# Patient Record
Sex: Female | Born: 1995 | Race: White | Hispanic: No | Marital: Single | Smoking: Never smoker
Health system: Southern US, Community
[De-identification: ages and names within clinical notes are randomized; demographics above are authoritative.]

## PROBLEM LIST (undated history)

## (undated) DIAGNOSIS — J45909 Unspecified asthma, uncomplicated: Secondary | ICD-10-CM

## (undated) DIAGNOSIS — E119 Type 2 diabetes mellitus without complications: Secondary | ICD-10-CM

## (undated) DIAGNOSIS — N200 Calculus of kidney: Secondary | ICD-10-CM

## (undated) HISTORY — DX: Calculus of kidney: N20.0

## (undated) HISTORY — DX: Type 2 diabetes mellitus without complications: E11.9

## (undated) HISTORY — DX: Unspecified asthma, uncomplicated: J45.909

## (undated) HISTORY — PX: OTHER SURGICAL HISTORY: SHX169

---
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
## 2015-06-28 DIAGNOSIS — L03112 Cellulitis of left axilla: Secondary | ICD-10-CM | POA: Diagnosis not present

## 2015-08-06 DIAGNOSIS — Z113 Encounter for screening for infections with a predominantly sexual mode of transmission: Secondary | ICD-10-CM | POA: Diagnosis not present

## 2015-08-06 DIAGNOSIS — Z01419 Encounter for gynecological examination (general) (routine) without abnormal findings: Secondary | ICD-10-CM | POA: Diagnosis not present

## 2015-08-06 DIAGNOSIS — Z6829 Body mass index (BMI) 29.0-29.9, adult: Secondary | ICD-10-CM | POA: Diagnosis not present

## 2015-08-12 DIAGNOSIS — N390 Urinary tract infection, site not specified: Secondary | ICD-10-CM | POA: Diagnosis not present

## 2015-11-28 DIAGNOSIS — N9089 Other specified noninflammatory disorders of vulva and perineum: Secondary | ICD-10-CM | POA: Diagnosis not present

## 2015-12-11 DIAGNOSIS — L905 Scar conditions and fibrosis of skin: Secondary | ICD-10-CM | POA: Diagnosis not present

## 2015-12-16 DIAGNOSIS — J029 Acute pharyngitis, unspecified: Secondary | ICD-10-CM | POA: Diagnosis not present

## 2016-03-19 DIAGNOSIS — J351 Hypertrophy of tonsils: Secondary | ICD-10-CM | POA: Diagnosis not present

## 2016-03-19 DIAGNOSIS — K219 Gastro-esophageal reflux disease without esophagitis: Secondary | ICD-10-CM | POA: Diagnosis not present

## 2016-03-19 DIAGNOSIS — J358 Other chronic diseases of tonsils and adenoids: Secondary | ICD-10-CM | POA: Diagnosis not present

## 2016-06-25 DIAGNOSIS — N390 Urinary tract infection, site not specified: Secondary | ICD-10-CM | POA: Diagnosis not present

## 2017-01-14 DIAGNOSIS — Z1389 Encounter for screening for other disorder: Secondary | ICD-10-CM | POA: Diagnosis not present

## 2017-01-14 DIAGNOSIS — M542 Cervicalgia: Secondary | ICD-10-CM | POA: Diagnosis not present

## 2017-01-14 DIAGNOSIS — J302 Other seasonal allergic rhinitis: Secondary | ICD-10-CM | POA: Diagnosis not present

## 2017-01-14 DIAGNOSIS — H04129 Dry eye syndrome of unspecified lacrimal gland: Secondary | ICD-10-CM | POA: Diagnosis not present

## 2018-01-18 DIAGNOSIS — J40 Bronchitis, not specified as acute or chronic: Secondary | ICD-10-CM | POA: Diagnosis not present

## 2018-01-18 DIAGNOSIS — Z6838 Body mass index (BMI) 38.0-38.9, adult: Secondary | ICD-10-CM | POA: Diagnosis not present

## 2018-01-18 DIAGNOSIS — R05 Cough: Secondary | ICD-10-CM | POA: Diagnosis not present

## 2018-01-18 DIAGNOSIS — J069 Acute upper respiratory infection, unspecified: Secondary | ICD-10-CM | POA: Diagnosis not present

## 2018-01-23 ENCOUNTER — Emergency Department (HOSPITAL_COMMUNITY): Payer: BLUE CROSS/BLUE SHIELD

## 2018-01-23 ENCOUNTER — Other Ambulatory Visit: Payer: Self-pay

## 2018-01-23 ENCOUNTER — Encounter (HOSPITAL_COMMUNITY): Payer: Self-pay | Admitting: Emergency Medicine

## 2018-01-23 ENCOUNTER — Emergency Department (HOSPITAL_COMMUNITY)
Admission: EM | Admit: 2018-01-23 | Discharge: 2018-01-23 | Disposition: A | Discharge status: Home / Self Care | Payer: BLUE CROSS/BLUE SHIELD | Attending: Emergency Medicine | Admitting: Emergency Medicine

## 2018-01-23 DIAGNOSIS — R0602 Shortness of breath: Secondary | ICD-10-CM | POA: Diagnosis not present

## 2018-01-23 DIAGNOSIS — R000 Tachycardia, unspecified: Secondary | ICD-10-CM | POA: Diagnosis not present

## 2018-01-23 LAB — CBC WITH DIFFERENTIAL/PLATELET
Abs Immature Granulocytes: 0.04 10*3/uL (ref 0.00–0.07)
Basophils Absolute: 0 10*3/uL (ref 0.0–0.1)
Basophils Relative: 0 %
Eosinophils Absolute: 0.1 10*3/uL (ref 0.0–0.5)
Eosinophils Relative: 1 %
HCT: 42.5 % (ref 36.0–46.0)
Hemoglobin: 13.9 g/dL (ref 12.0–15.0)
Immature Granulocytes: 0 %
Lymphocytes Relative: 11 %
Lymphs Abs: 1.2 10*3/uL (ref 0.7–4.0)
MCH: 29.2 pg (ref 26.0–34.0)
MCHC: 32.7 g/dL (ref 30.0–36.0)
MCV: 89.3 fL (ref 80.0–100.0)
Monocytes Absolute: 0.5 10*3/uL (ref 0.1–1.0)
Monocytes Relative: 5 %
Neutro Abs: 8.8 10*3/uL — ABNORMAL HIGH (ref 1.7–7.7)
Neutrophils Relative %: 83 %
Platelets: 311 10*3/uL (ref 150–400)
RBC: 4.76 MIL/uL (ref 3.87–5.11)
RDW: 12.5 % (ref 11.5–15.5)
WBC: 10.7 10*3/uL — ABNORMAL HIGH (ref 4.0–10.5)
nRBC: 0 % (ref 0.0–0.2)

## 2018-01-23 LAB — BASIC METABOLIC PANEL
Anion gap: 13 (ref 5–15)
BUN: 11 mg/dL (ref 6–20)
CO2: 20 mmol/L — ABNORMAL LOW (ref 22–32)
Calcium: 9.6 mg/dL (ref 8.9–10.3)
Chloride: 108 mmol/L (ref 98–111)
Creatinine, Ser: 0.76 mg/dL (ref 0.44–1.00)
GFR calc Af Amer: >60 mL/min (ref >60)
GFR calc non Af Amer: >60 mL/min (ref >60)
Glucose, Bld: 108 mg/dL — ABNORMAL HIGH (ref 70–99)
Potassium: 3.3 mmol/L — ABNORMAL LOW (ref 3.5–5.1)
Sodium: 141 mmol/L (ref 135–145)

## 2018-01-23 LAB — I-STAT BETA HCG BLOOD, ED (MC, WL, AP ONLY): I-stat hCG, quantitative: <5 m[IU]/mL (ref <5)

## 2018-01-23 LAB — D-DIMER, QUANTITATIVE: D-Dimer, Quant: <0.27 ug/mL-FEU (ref 0.00–0.50)

## 2018-01-23 MED ORDER — FLUTICASONE PROPIONATE 50 MCG/ACT NA SUSP: 1.0000 | Freq: Every day | NASAL | 0 refills | Status: DC

## 2018-01-23 MED ORDER — SODIUM CHLORIDE 0.9 % IV BOLUS
1000.0000 mL | Freq: Once | INTRAVENOUS | Status: AC
  Administered 2018-01-23: 1000 mL via INTRAVENOUS

## 2018-01-23 MED ORDER — ALBUTEROL SULFATE HFA 108 (90 BASE) MCG/ACT IN AERS: 1.0000 | INHALATION_SPRAY | Freq: Four times a day (QID) | RESPIRATORY_TRACT | 0 refills | Status: AC | PRN

## 2018-01-23 MED ORDER — POTASSIUM CHLORIDE CRYS ER 20 MEQ PO TBCR
30.0000 meq | EXTENDED_RELEASE_TABLET | Freq: Once | ORAL | Status: AC
  Administered 2018-01-23: 30 meq via ORAL
  Filled 2018-01-23: qty 1

## 2018-01-23 MED ORDER — NAPROXEN 500 MG PO TABS: 500.0000 mg | ORAL_TABLET | Freq: Two times a day (BID) | ORAL | 0 refills | Status: DC

## 2018-01-23 MED ORDER — BENZONATATE 100 MG PO CAPS: 100.0000 mg | ORAL_CAPSULE | Freq: Three times a day (TID) | ORAL | 0 refills | Status: DC

## 2018-01-23 NOTE — ED Provider Notes (Signed)
[facility]-EMERGENCY DEPT Provider Note   CSN: [id number] Arrival date & time: 01/23/18  1911     History   Chief Complaint Chief Complaint  Patient presents with  . Shortness of Breath    HPI Robynn PaneKayleigh M Grandberry is a 23 y.o. female without significant past medical hx who presents to the ED with complaints of dyspnea x 2-3 weeks. She states dyspnea is intermittent without specific alleviating/aggravating factors or triggers. Relays some chest discomfort bilaterally when taking a deep breath, otherwise no significant chest pain, does have some pain with coughing. She states she has also had some URI sxs including congestion, dry cough, ear discomfort, and chills. Seen by PCP 5 days prior, had CXR in office that PCP interpreted as possible pneumonia pending formal radiology read. She was given prescriptions for Levaquin, prednisone, and ventolin inhaler. States that she has been taking levaquin as prescribed and using ventolin once per day. States ventolin sometimes helps, she did use it just PTA without much change. She has not taken the prednisone secondary to other family members having adverse reactions. She states since starting abx she has had some intermittent lightheadedness and admits to decreased appetite. Denies fever, chills,  leg pain/swelling, hemoptysis, recent surgery/trauma, recent long travel, hormone use, personal hx of cancer, or hx of DVT/PE.    HPI  History reviewed. No pertinent past medical history.  There are no active problems to display for this patient.   History reviewed. No pertinent surgical history.   OB History   No obstetric history on file.      Home Medications    Prior to Admission medications   Not on File    Family History No family history on file.  Social History Social History   Tobacco Use  . Smoking status: Not on file  Substance Use Topics  . Alcohol use: Not on file  . Drug use: Not on file      Allergies   Patient has no known allergies.   Review of Systems Review of Systems  Constitutional: Positive for appetite change and chills. Negative for fever.  HENT: Positive for congestion and ear pain. Negative for sore throat.   Respiratory: Positive for cough and shortness of breath.   Cardiovascular: Positive for chest pain. Negative for leg swelling.  Gastrointestinal: Negative for abdominal pain, diarrhea and vomiting.  Genitourinary: Negative for dysuria.  Neurological: Positive for light-headedness. Negative for syncope.  All other systems reviewed and are negative.    Physical Exam Updated Vital Signs BP (!) 150/76 (BP Location: Left Arm)   Pulse (!) 112   Temp 97.9 F (36.6 C) (Oral)   Resp 16   Ht 5\' 3"  (1.6 m)   Wt 99.8 kg   LMP 10/26/2017 (Approximate)   SpO2 100%   BMI 38.97 kg/m   Physical Exam Vitals signs and nursing note reviewed.  Constitutional:      General: She is not in acute distress.    Appearance: She is well-developed.  HENT:     Head: Normocephalic and atraumatic.     Right Ear: Ear canal and external ear normal. Tympanic membrane is not perforated, erythematous, retracted or bulging.     Left Ear: Ear canal and external ear normal. Tympanic membrane is not perforated, erythematous, retracted or bulging.     Nose: Congestion present.     Mouth/Throat:     Pharynx: Uvula midline. No oropharyngeal exudate or posterior oropharyngeal erythema.  Eyes:     General:  Right eye: No discharge.        Left eye: No discharge.     Conjunctiva/sclera: Conjunctivae normal.     Pupils: Pupils are equal, round, and reactive to light.  Neck:     Musculoskeletal: Normal range of motion and neck supple. No edema or neck rigidity.  Cardiovascular:     Rate and Rhythm: Regular rhythm. Tachycardia present.     Heart sounds: No murmur.  Pulmonary:     Effort: Pulmonary effort is normal. No respiratory distress.     Breath sounds: Normal  breath sounds. No wheezing, rhonchi or rales.  Chest:     Chest wall: Tenderness (anterior lateral chest wall bilaterally without palpable deformity or crepitus, no overlying skin changes) present.  Abdominal:     General: There is no distension.     Palpations: Abdomen is soft.     Tenderness: There is no abdominal tenderness.  Musculoskeletal:     Right lower leg: She exhibits no tenderness. No edema.     Left lower leg: She exhibits no tenderness. No edema.  Lymphadenopathy:     Cervical: No cervical adenopathy.  Skin:    General: Skin is warm and dry.     Findings: No rash.  Neurological:     Mental Status: She is alert.     Comments: CN III-XII grossly intact. Negative pronator drift. Ambulatory w/ steady gait.   Psychiatric:        Behavior: Behavior normal.      ED Treatments / Results  Labs (all labs ordered are listed, but only abnormal results are displayed) Labs Reviewed  BASIC METABOLIC PANEL - Abnormal; Notable for the following components:      Result Value   Potassium 3.3 (*)    CO2 20 (*)    Glucose, Bld 108 (*)    All other components within normal limits  CBC WITH DIFFERENTIAL/PLATELET - Abnormal; Notable for the following components:   WBC 10.7 (*)    Neutro Abs 8.8 (*)    All other components within normal limits  D-DIMER, QUANTITATIVE (NOT AT [facility])  I-STAT BETA HCG BLOOD, ED ([facility], [facility], [facility] ONLY)    EKG EKG Interpretation  Date/Time:  Monday January 23 2018 20:37:15 EST Ventricular Rate:  91 PR Interval:    QRS Duration: 88 QT Interval:  351 QTC Calculation: 432 R Axis:   76 Text Interpretation:  Sinus rhythm No significant change since last tracing Confirmed by Linwood Dibbles [phone]) on 01/24/2018 12:54:22 PM   Radiology Dg Chest 2 View  Result Date: 01/23/2018 CLINICAL DATA:  Shortness of Breath EXAM: CHEST - 2 VIEW COMPARISON:  None. FINDINGS: The lungs are clear. The heart size and pulmonary vascularity are normal. No adenopathy. No bone  lesions. IMPRESSION: No edema or consolidation. Electronically Signed   By: Bretta Bang III M.D.   On: 01/23/2018 20:17    Procedures Procedures (including critical care time)  Medications Ordered in ED Medications - No data to display   Initial Impression / Assessment and Plan / ED Course  I have reviewed the triage vital signs and the nursing notes.  Pertinent labs & imaging results that were available during my care of the patient were reviewed by me and considered in my medical decision making (see chart for details).   Patient presents to the ED with complaints of dyspnea. Nontoxic appearing, in no apparent distress, initial vitals notable for tachycardia & HTN, low suspicion for HTN emergency. Tachycardic on exam. Lungs CTA,  no evidence of respiratory distress, SpO2 100% on RA. DDX: pneumonia, pneumothorax, effusion, edema, pulmonary embolism, ACS, viral URI, anxiety. Evaluate with CXR, EKG, and labs to include d-dimer given patient is low risks wells but is not PERC negative secondary to initial tachycardia.   CXR negative- no evidence of infiltrate to suggest pneumonia, patient is currently on Levaquin and without concerning CXR findings does not seem consistent with failed outpatient therapy. No pneumothorax, effusion, edema, or structural abnormalities noted. Labs with leukocytosis at 10.7, feel this is nonspecific currently. No anemia. Mild hypokalemia at 3.3- oral replacement and diet information. No other electrolyte derangement. Bicarb mildly low at 20, no gap. Patient is not pregnant. EKG without ischemic changes or concerning arrhythmia. D-dimer negative- doubt PE. I personally ambulated patient on monitory, ambulatory SpO2 100%. HR normalized. Feel her lightheadedness may be due to abx consumption w/ decreased PO intake, no neuro deficits, syncope, or arrhythmias to raise concern regarding this complaint. Overall suspect viral URI, possibly bronchitis given she has some  improvement with ventolin at home. No wheezing here to indicate need for neb tx. Encouraged her to complete steroids at home and follow up closely w/ PCP. Additional symptomatic care with naproxen, flonase, & tessalon. I discussed results, treatment plan, need for follow-up, and return precautions with the patient. Provided opportunity for questions, patient confirmed understanding and is in agreement with plan.   Vitals:   01/23/18 2200 01/23/18 2212  BP:  (!) 115/92  Pulse:  78  Resp: 16   Temp: 97.8 F (36.6 C)   SpO2:  100%    Final Clinical Impressions(s) / ED Diagnoses   Final diagnoses:  SOB (shortness of breath)    ED Discharge Orders         Ordered    fluticasone (FLONASE) 50 MCG/ACT nasal spray  Daily     01/23/18 2204    benzonatate (TESSALON) 100 MG capsule  Every 8 hours     01/23/18 2204    albuterol (PROVENTIL HFA;VENTOLIN HFA) 108 (90 Base) MCG/ACT inhaler  Every 6 hours PRN     01/23/18 2204    naproxen (NAPROSYN) 500 MG tablet  2 times daily     01/23/18 2204           Cherly Andersonetrucelli, Daniella Dewberry R, PA-C 01/24/18 2205    Raeford RazorKohut, Stephen, MD 01/25/18 1601

## 2018-01-23 NOTE — Discharge Instructions (Signed)
You were seen in the emergency today for upper respiratory symptoms. Your chest xray was normal. Your labs were reassuring- your potassium was a bit low, follow the attached guidelines.  we suspect your symptoms are related to a virus at this time. Please take the following medicines.   -Flonase to be used 1 spray in each nostril daily.  This medication is used to treat your congestion.  -Tessalon can be taken once every 8 hours as needed.  This medication is used to treat your cough.  -- Naproxen is a nonsteroidal anti-inflammatory medication that will help with pain and swelling. Be sure to take this medication as prescribed with food, 1 pill every 12 hours,  It should be taken with food, as it can cause stomach upset, and more seriously, stomach bleeding. Do not take other nonsteroidal anti-inflammatory medications with this such as Advil, Motrin, Aleve, Mobic, Goodie Powder, or Motrin.    - Albuterol inhaler- use 1-2 puffs every 4-6 hours as needed for shortness of breath, wheezing, or chest tightness.   You make take Tylenol per over the counter dosing with these medications.   We have prescribed you new medication(s) today. Discuss the medications prescribed today with your pharmacist as they can have adverse effects and interactions with your other medicines including over the counter and prescribed medications. Seek medical evaluation if you start to experience new or abnormal symptoms after taking one of these medicines, seek care immediately if you start to experience difficulty breathing, feeling of your throat closing, facial swelling, or rash as these could be indications of a more serious allergic reaction  You will need to follow-up with your primary care provider in 1 week if your symptoms have not improved.  If you do not have a primary care provider one is provided in your discharge instructions.  Return to the emergency department for any new or worsening symptoms including but not  limited to persistent fever for 5 days, difficulty breathing, chest pain, rashes, passing out, or any other concerns.

## 2018-01-23 NOTE — ED Triage Notes (Signed)
Pt arriving with shortness of breath. Has been taking Levaquin since last Thursday after being diagnosed with possible pneumonia. Pt reports symptoms have only gotten worse. Also having dizziness.

## 2018-03-15 DIAGNOSIS — R0602 Shortness of breath: Secondary | ICD-10-CM | POA: Diagnosis not present

## 2018-03-15 DIAGNOSIS — R062 Wheezing: Secondary | ICD-10-CM | POA: Diagnosis not present

## 2018-03-20 ENCOUNTER — Ambulatory Visit (INDEPENDENT_AMBULATORY_CARE_PROVIDER_SITE_OTHER): Payer: BLUE CROSS/BLUE SHIELD | Admitting: Emergency Medicine

## 2018-03-20 ENCOUNTER — Encounter: Payer: Self-pay | Admitting: Emergency Medicine

## 2018-03-20 VITALS — BP 110/80 | HR 76 | Ht 64.0 in | Wt 213.0 lb

## 2018-03-20 DIAGNOSIS — R0602 Shortness of breath: Secondary | ICD-10-CM | POA: Diagnosis not present

## 2018-03-20 DIAGNOSIS — R0600 Dyspnea, unspecified: Secondary | ICD-10-CM | POA: Diagnosis not present

## 2018-03-20 NOTE — Patient Instructions (Signed)
We will order pulmonary function testing to evaluate your breathing and airflow.  Unclear whether you may be having asthma symptoms. Go ahead and finish the prednisone that you were given at urgent care You can keep your albuterol available to use 2 puffs if needed for shortness of breath, chest tightness, wheezing. Follow with Dr Delton Coombes next available with full PFT on the same day

## 2018-03-20 NOTE — Progress Notes (Signed)
Subjective:    Patient ID: Becky Armstrong, female    DOB: 01-20-1995, 23 y.o.   MRN: [id number]  HPI 23 year old never smoker with little past medical history. She doesn't have a formal dx of childhood asthma, did have allergies, did have an inhaled BD as a youth.  She is referred today for evaluation of dyspnea.  She was seen in the emergency department in mid January with 2 to 3 weeks of shortness of breath, discomfort with a deep breath, possibly some associated URI symptoms.  Prior to that she been treated for a possible clinical pneumonia.  Chest x-ray on 01/23/2018 was normal. She does have allergic rhinitis in Spring and Summer, sometimes the Fall. Sometimes takes allegra.   She reports today that she well until she developed some increased cough, sinus congestion and pressure, not a lot of drainage. Began to have cough, initially dry, occasionally now prod of some colored mucous. She describes difficulty taking a deep breath, can happen with activity, with stairs. She had an episode of acute dyspnea, "heart racing", anxious, was seen at [facility]. She was started on prednisone at [facility] (4 days ago). No CP, wheeze.    Review of Systems  Constitutional: Negative for fever and unexpected weight change.  HENT: Negative for congestion, dental problem, ear pain, nosebleeds, postnasal drip, rhinorrhea, sinus pressure, sneezing, sore throat and trouble swallowing.   Eyes: Negative for redness and itching.  Respiratory: Positive for cough, chest tightness, shortness of breath and wheezing.   Cardiovascular: Negative for palpitations and leg swelling.  Gastrointestinal: Negative for nausea and vomiting.  Genitourinary: Negative for dysuria.  Musculoskeletal: Negative for joint swelling.  Skin: Negative for rash.  Neurological: Negative for headaches.  Hematological: Does not bruise/bleed easily.  Psychiatric/Behavioral: Negative for dysphoric mood. The patient is not nervous/anxious.     No past  medical history on file.   No family history on file.   Social History   Socioeconomic History  . Marital status: Single    Spouse name: Not on file  . Number of children: Not on file  . Years of education: Not on file  . Highest education level: Not on file  Occupational History  . Not on file  Social Needs  . Financial resource strain: Not on file  . Food insecurity:    Worry: Not on file    Inability: Not on file  . Transportation needs:    Medical: Not on file    Non-medical: Not on file  Tobacco Use  . Smoking status: Never Smoker  . Smokeless tobacco: Never Used  Substance and Sexual Activity  . Alcohol use: Not on file  . Drug use: Not on file  . Sexual activity: Not on file  Lifestyle  . Physical activity:    Days per week: Not on file    Minutes per session: Not on file  . Stress: Not on file  Relationships  . Social connections:    Talks on phone: Not on file    Gets together: Not on file    Attends religious service: Not on file    Active member of club or organization: Not on file    Attends meetings of clubs or organizations: Not on file    Relationship status: Not on file  . Intimate partner violence:    Fear of current or ex partner: Not on file    Emotionally abused: Not on file    Physically abused: Not on file  Forced sexual activity: Not on file  Other Topics Concern  . Not on file  Social History Narrative  . Not on file     No Known Allergies   Outpatient Medications Prior to Visit  Medication Sig Dispense Refill  . albuterol (PROVENTIL HFA;VENTOLIN HFA) 108 (90 Base) MCG/ACT inhaler Inhale 1-2 puffs into the lungs every 6 (six) hours as needed for wheezing or shortness of breath. 1 Inhaler 0  . predniSONE (DELTASONE) 10 MG tablet Take as directed - Taper    . benzonatate (TESSALON) 100 MG capsule Take 1 capsule (100 mg total) by mouth every 8 (eight) hours. 21 capsule 0  . fluticasone (FLONASE) 50 MCG/ACT nasal spray Place 1 spray  into both nostrils daily. 16 g 0  . naproxen (NAPROSYN) 500 MG tablet Take 1 tablet (500 mg total) by mouth 2 (two) times daily. 10 tablet 0   No facility-administered medications prior to visit.         Objective:   Physical Exam  Vitals:   03/20/18 1111  BP: 110/80  Pulse: 76  SpO2: 98%  Weight: 213 lb (96.6 kg)  Height: 5\' 4"  (1.626 m)   Gen: Pleasant, overwt, in no distress,  normal affect  ENT: No lesions,  mouth clear,  oropharynx clear, no postnasal drip  Neck: No JVD, no stridor  Lungs: No use of accessory muscles, no crackles or wheezing on normal respiration, no wheeze on forced expiration  Cardiovascular: RRR, heart sounds normal, no murmur or gallops, no peripheral edema  Musculoskeletal: No deformities, no cyanosis or clubbing  Neuro: alert, awake, non focal  Skin: Warm, no lesions or rash    Assessment & Plan:  Dyspnea Etiology unclear.  She and her mother both note that there is a contribution of allergies, possibly also anxiety.  She does have symptoms that could be consistent with obstruction, including occasional wheezing.  She feels better on the prednisone that she just received.  I think she needs pulmonary function testing to determine whether she has true AFL, if so then this is likely adult onset asthma.  She is concerned about the cost the PFT which is understandable.  She will let me know if she feels like she can proceed.  Levy Pupa, MD, PhD 03/20/2018, 11:52 AM [facility] [phone] or if no answer [phone]

## 2018-03-20 NOTE — Assessment & Plan Note (Signed)
Etiology unclear.  She and her mother both note that there is a contribution of allergies, possibly also anxiety.  She does have symptoms that could be consistent with obstruction, including occasional wheezing.  She feels better on the prednisone that she just received.  I think she needs pulmonary function testing to determine whether she has true AFL, if so then this is likely adult onset asthma.  She is concerned about the cost the PFT which is understandable.  She will let me know if she feels like she can proceed.

## 2018-04-19 ENCOUNTER — Encounter: Payer: Self-pay | Admitting: Emergency Medicine

## 2018-04-28 ENCOUNTER — Ambulatory Visit: Payer: BLUE CROSS/BLUE SHIELD | Admitting: Emergency Medicine

## 2018-06-07 ENCOUNTER — Encounter (HOSPITAL_COMMUNITY): Payer: Self-pay | Admitting: Emergency Medicine

## 2018-06-07 ENCOUNTER — Emergency Department (HOSPITAL_COMMUNITY): Payer: BLUE CROSS/BLUE SHIELD

## 2018-06-07 ENCOUNTER — Emergency Department (HOSPITAL_COMMUNITY)
Admission: EM | Admit: 2018-06-07 | Discharge: 2018-06-07 | Disposition: A | Discharge status: Home / Self Care | Payer: BLUE CROSS/BLUE SHIELD | Attending: Emergency Medicine | Admitting: Emergency Medicine

## 2018-06-07 ENCOUNTER — Other Ambulatory Visit: Payer: Self-pay

## 2018-06-07 DIAGNOSIS — R5383 Other fatigue: Secondary | ICD-10-CM | POA: Insufficient documentation

## 2018-06-07 DIAGNOSIS — R0602 Shortness of breath: Secondary | ICD-10-CM

## 2018-06-07 DIAGNOSIS — R42 Dizziness and giddiness: Secondary | ICD-10-CM | POA: Diagnosis not present

## 2018-06-07 DIAGNOSIS — R5381 Other malaise: Secondary | ICD-10-CM | POA: Diagnosis not present

## 2018-06-07 DIAGNOSIS — R0789 Other chest pain: Secondary | ICD-10-CM | POA: Insufficient documentation

## 2018-06-07 DIAGNOSIS — R079 Chest pain, unspecified: Secondary | ICD-10-CM | POA: Diagnosis not present

## 2018-06-07 LAB — CBC
HCT: 40.8 % (ref 36.0–46.0)
Hemoglobin: 13.9 g/dL (ref 12.0–15.0)
MCH: 30 pg (ref 26.0–34.0)
MCHC: 34.1 g/dL (ref 30.0–36.0)
MCV: 87.9 fL (ref 80.0–100.0)
Platelets: 338 10*3/uL (ref 150–400)
RBC: 4.64 MIL/uL (ref 3.87–5.11)
RDW: 12.4 % (ref 11.5–15.5)
WBC: 9.3 10*3/uL (ref 4.0–10.5)
nRBC: 0 % (ref 0.0–0.2)

## 2018-06-07 LAB — I-STAT BETA HCG BLOOD, ED (MC, WL, AP ONLY): I-stat hCG, quantitative: <5 m[IU]/mL (ref <5)

## 2018-06-07 LAB — BASIC METABOLIC PANEL
Anion gap: 15 (ref 5–15)
BUN: 9 mg/dL (ref 6–20)
CO2: 20 mmol/L — ABNORMAL LOW (ref 22–32)
Calcium: 9.3 mg/dL (ref 8.9–10.3)
Chloride: 106 mmol/L (ref 98–111)
Creatinine, Ser: 0.85 mg/dL (ref 0.44–1.00)
GFR calc Af Amer: >60 mL/min (ref >60)
GFR calc non Af Amer: >60 mL/min (ref >60)
Glucose, Bld: 124 mg/dL — ABNORMAL HIGH (ref 70–99)
Potassium: 3.9 mmol/L (ref 3.5–5.1)
Sodium: 141 mmol/L (ref 135–145)

## 2018-06-07 LAB — TROPONIN I: Troponin I: <0.03 ng/mL (ref <0.03)

## 2018-06-07 MED ORDER — HYDROXYZINE HCL 25 MG PO TABS
25.0000 mg | ORAL_TABLET | Freq: Once | ORAL | Status: AC
  Administered 2018-06-07: 25 mg via ORAL
  Filled 2018-06-07: qty 1

## 2018-06-07 MED ORDER — HYDROXYZINE HCL 25 MG PO TABS: 25.0000 mg | ORAL_TABLET | Freq: Every evening | ORAL | 0 refills | Status: DC | PRN

## 2018-06-07 NOTE — ED Provider Notes (Signed)
[facility] EMERGENCY DEPARTMENT Provider Note   CSN: [id number] Arrival date & time: 06/07/18  0344    History   Chief Complaint Chief Complaint  Patient presents with  . Chest Pain  . Shortness of Breath    HPI Becky Armstrong is a 23 y.o. female.     23 year old female with a history of seasonal allergies presents to the emergency department for evaluation of shortness of breath.  She has been having intermittent shortness of breath over the past 4 months with associated central chest tightness.  Was seen outpatient by pulmonology who coordinated an outpatient pulmonary function test, though patient has been unable to complete this secondary to the coronavirus outbreak/restrictions.  Was given an albuterol inhaler to use, but only use this on one occasion as she felt it made her symptoms worse.  She does tend to notice aggravation of her shortness of breath at nighttime.  She awoke from sleep yesterday with similar shortness of breath and palpitations.  Tonight she felt a sense of "doom" with generalized fatigue and malaise, some mild lightheadedness.  The patient, overall, is feeling better at this time.  She is not on any birth control and denies any recent surgeries or hospitalizations.  Further denies associated fevers, hemoptysis, leg swelling, syncope or near syncope, chest pain, nausea, vomiting.     History reviewed. No pertinent past medical history.  Patient Active Problem List   Diagnosis Date Noted  . Dyspnea 03/20/2018    History reviewed. No pertinent surgical history.   OB History   No obstetric history on file.      Home Medications    Prior to Admission medications   Medication Sig Start Date End Date Taking? Authorizing Provider  albuterol (PROVENTIL HFA;VENTOLIN HFA) 108 (90 Base) MCG/ACT inhaler Inhale 1-2 puffs into the lungs every 6 (six) hours as needed for wheezing or shortness of breath. 01/23/18   Petrucelli, Samantha R,  PA-C  hydrOXYzine (ATARAX/VISTARIL) 25 MG tablet Take 1 tablet (25 mg total) by mouth at bedtime as needed. 06/07/18   Antony Madura, PA-C  predniSONE (DELTASONE) 10 MG tablet Take as directed - Taper    [provider]    Family History No family history on file.  Social History Social History   Tobacco Use  . Smoking status: Never Smoker  . Smokeless tobacco: Never Used  Substance Use Topics  . Alcohol use: Not on file  . Drug use: Not on file     Allergies   Patient has no known allergies.   Review of Systems Review of Systems Ten systems reviewed and are negative for acute change, except as noted in the HPI.    Physical Exam Updated Vital Signs BP 109/61   Pulse 89   Temp 98.7 F (37.1 C) (Oral)   Resp 14   Wt 96.6 kg   LMP 06/02/2018   SpO2 100%   BMI 36.56 kg/m   Physical Exam Vitals signs and nursing note reviewed.  Constitutional:      General: She is not in acute distress.    Appearance: She is well-developed. She is not diaphoretic.     Comments: Nontoxic appearing and in NAD  HENT:     Head: Normocephalic and atraumatic.  Eyes:     General: No scleral icterus.    Conjunctiva/sclera: Conjunctivae normal.  Neck:     Musculoskeletal: Normal range of motion.  Cardiovascular:     Rate and Rhythm: Normal rate and regular rhythm.  Pulses: Normal pulses.  Pulmonary:     Effort: Pulmonary effort is normal. No respiratory distress.     Breath sounds: No stridor. No wheezing, rhonchi or rales.     Comments: Respirations even and unlabored. Lungs CTAB. Musculoskeletal: Normal range of motion.  Skin:    General: Skin is warm and dry.     Coloration: Skin is not pale.     Findings: No erythema or rash.  Neurological:     General: No focal deficit present.     Mental Status: She is alert and oriented to person, place, and time.     Coordination: Coordination normal.     Comments: GCS 15. Speech is goal oriented. Moving all extremities  spontaneously.  Psychiatric:        Mood and Affect: Mood is anxious.        Behavior: Behavior normal.      ED Treatments / Results  Labs (all labs ordered are listed, but only abnormal results are displayed) Labs Reviewed  BASIC METABOLIC PANEL - Abnormal; Notable for the following components:      Result Value   CO2 20 (*)    Glucose, Bld 124 (*)    All other components within normal limits  CBC  TROPONIN I  I-STAT BETA HCG BLOOD, ED ([facility], [facility], [facility] ONLY)    EKG EKG Interpretation  Date/Time:  Wednesday Jun 07 2018 03:48:09 EDT Ventricular Rate:  117 PR Interval:  126 QRS Duration: 84 QT Interval:  322 QTC Calculation: 449 R Axis:   62 Text Interpretation:  Sinus tachycardia Nonspecific ST and T wave abnormality Abnormal ECG No significant change since last tracing Confirmed by Drema Pry [id number]) on 06/07/2018 4:37:40 AM   Radiology Dg Chest 2 View  Result Date: 06/07/2018 CLINICAL DATA:  Chest pain EXAM: CHEST - 2 VIEW COMPARISON:  01/23/2018 FINDINGS: Normal heart size and mediastinal contours. No acute infiltrate or edema. No effusion or pneumothorax. No acute osseous findings. Slight scoliosis. IMPRESSION: No active cardiopulmonary disease. Electronically Signed   By: Marnee Spring M.D.   On: 06/07/2018 04:33    Procedures Procedures (including critical care time)  Medications Ordered in ED Medications  hydrOXYzine (ATARAX/VISTARIL) tablet 25 mg (25 mg Oral Given 06/07/18 0531)     Initial Impression / Assessment and Plan / ED Course  I have reviewed the triage vital signs and the nursing notes.  Pertinent labs & imaging results that were available during my care of the patient were reviewed by me and considered in my medical decision making (see chart for details).        Patient presents to the emergency department for evaluation of shortness of breath and chest tightness.  EKG is stable compared with prior evaluation and troponin negative.  Chest  x-ray without evidence of mediastinal widening to suggest dissection.  No pneumothorax, pneumonia, pleural effusion.  Pulmonary embolus further considered; however, patient without tachycardia, tachypnea, dyspnea, hypoxia.  Patient is PERC negative and with hx of negative D dimer in January in the setting of similar complaints.  Patient has been encouraged to complete her pulmonary function testing coordinated through her pulmonologist.  I do suspect that there is an anxiety component contributing to her shortness of breath and chest tightness.  Will trial on a short course of nightly Atarax as needed.  She has been advised to follow-up with her primary care doctor as well.  Return precautions discussed and provided. Patient discharged in stable condition with no unaddressed concerns.  Final Clinical Impressions(s) / ED Diagnoses   Final diagnoses:  Shortness of breath    ED Discharge Orders         Ordered    hydrOXYzine (ATARAX/VISTARIL) 25 MG tablet  At bedtime PRN     06/07/18 0530           Antony MaduraHumes, Khoury Siemon, PA-C 06/07/18 0541    Nira Connardama, Pedro Eduardo, MD 06/07/18 Rickey [id number]

## 2018-06-07 NOTE — ED Triage Notes (Addendum)
Pt in with c/o central cp x 3 days, also reporting sob for same. Denies any fevers, does have dry cough. States she has had undiagnosed breathing issues since January, has been unable to get pulmonary function test d/t Covid restrictions. States she feels increasingly weak since last night.

## 2018-06-07 NOTE — Discharge Instructions (Addendum)
We recommend that you follow-up with your pulmonologist to discuss completion of your pulmonary function testing, though your emergency department evaluation today has been reassuring.  It is possible that your symptoms may be due to underlying and untreated anxiety.  For this, you have been prescribed Atarax to take at nighttime as needed.  Continue follow-up with your primary care doctor.  You may return to the ED for new or concerning symptoms.

## 2018-06-07 NOTE — ED Notes (Signed)
Patient verbalizes understanding of discharge instructions. Opportunity for questioning and answers were provided. Armband removed by staff, pt discharged from ED ambulatory.   

## 2018-06-08 DIAGNOSIS — F411 Generalized anxiety disorder: Secondary | ICD-10-CM | POA: Diagnosis not present

## 2018-06-20 DIAGNOSIS — F411 Generalized anxiety disorder: Secondary | ICD-10-CM | POA: Diagnosis not present

## 2018-07-12 DIAGNOSIS — F411 Generalized anxiety disorder: Secondary | ICD-10-CM | POA: Diagnosis not present

## 2018-08-07 DIAGNOSIS — F411 Generalized anxiety disorder: Secondary | ICD-10-CM | POA: Diagnosis not present

## 2018-08-28 DIAGNOSIS — F411 Generalized anxiety disorder: Secondary | ICD-10-CM | POA: Diagnosis not present

## 2018-09-20 DIAGNOSIS — F411 Generalized anxiety disorder: Secondary | ICD-10-CM | POA: Diagnosis not present

## 2018-09-29 DIAGNOSIS — M9904 Segmental and somatic dysfunction of sacral region: Secondary | ICD-10-CM | POA: Diagnosis not present

## 2018-09-29 DIAGNOSIS — M9905 Segmental and somatic dysfunction of pelvic region: Secondary | ICD-10-CM | POA: Diagnosis not present

## 2018-09-29 DIAGNOSIS — M9903 Segmental and somatic dysfunction of lumbar region: Secondary | ICD-10-CM | POA: Diagnosis not present

## 2018-09-29 DIAGNOSIS — M9902 Segmental and somatic dysfunction of thoracic region: Secondary | ICD-10-CM | POA: Diagnosis not present

## 2018-10-17 DIAGNOSIS — F411 Generalized anxiety disorder: Secondary | ICD-10-CM | POA: Diagnosis not present

## 2018-10-25 DIAGNOSIS — Z01419 Encounter for gynecological examination (general) (routine) without abnormal findings: Secondary | ICD-10-CM | POA: Diagnosis not present

## 2018-10-25 DIAGNOSIS — N920 Excessive and frequent menstruation with regular cycle: Secondary | ICD-10-CM | POA: Diagnosis not present

## 2018-10-25 DIAGNOSIS — Z6838 Body mass index (BMI) 38.0-38.9, adult: Secondary | ICD-10-CM | POA: Diagnosis not present

## 2018-10-25 DIAGNOSIS — Z118 Encounter for screening for other infectious and parasitic diseases: Secondary | ICD-10-CM | POA: Diagnosis not present

## 2018-10-25 DIAGNOSIS — N760 Acute vaginitis: Secondary | ICD-10-CM | POA: Diagnosis not present

## 2018-10-25 DIAGNOSIS — Z3200 Encounter for pregnancy test, result unknown: Secondary | ICD-10-CM | POA: Diagnosis not present

## 2018-10-25 DIAGNOSIS — Z113 Encounter for screening for infections with a predominantly sexual mode of transmission: Secondary | ICD-10-CM | POA: Diagnosis not present

## 2018-11-08 DIAGNOSIS — R739 Hyperglycemia, unspecified: Secondary | ICD-10-CM | POA: Diagnosis not present

## 2018-11-08 DIAGNOSIS — Z0000 Encounter for general adult medical examination without abnormal findings: Secondary | ICD-10-CM | POA: Diagnosis not present

## 2018-11-08 DIAGNOSIS — Z79899 Other long term (current) drug therapy: Secondary | ICD-10-CM | POA: Diagnosis not present

## 2018-11-08 DIAGNOSIS — R0789 Other chest pain: Secondary | ICD-10-CM | POA: Diagnosis not present

## 2018-11-13 DIAGNOSIS — F411 Generalized anxiety disorder: Secondary | ICD-10-CM | POA: Diagnosis not present

## 2018-11-15 DIAGNOSIS — Z0000 Encounter for general adult medical examination without abnormal findings: Secondary | ICD-10-CM | POA: Diagnosis not present

## 2018-11-15 DIAGNOSIS — R002 Palpitations: Secondary | ICD-10-CM | POA: Diagnosis not present

## 2018-11-15 DIAGNOSIS — Z1331 Encounter for screening for depression: Secondary | ICD-10-CM | POA: Diagnosis not present

## 2018-11-15 DIAGNOSIS — J302 Other seasonal allergic rhinitis: Secondary | ICD-10-CM | POA: Diagnosis not present

## 2018-11-15 DIAGNOSIS — F411 Generalized anxiety disorder: Secondary | ICD-10-CM | POA: Diagnosis not present

## 2018-11-15 DIAGNOSIS — J45909 Unspecified asthma, uncomplicated: Secondary | ICD-10-CM | POA: Diagnosis not present

## 2018-12-05 DIAGNOSIS — Z20828 Contact with and (suspected) exposure to other viral communicable diseases: Secondary | ICD-10-CM | POA: Diagnosis not present

## 2018-12-13 DIAGNOSIS — F411 Generalized anxiety disorder: Secondary | ICD-10-CM | POA: Diagnosis not present

## 2019-01-16 DIAGNOSIS — F411 Generalized anxiety disorder: Secondary | ICD-10-CM | POA: Diagnosis not present

## 2019-02-09 DIAGNOSIS — Z20828 Contact with and (suspected) exposure to other viral communicable diseases: Secondary | ICD-10-CM | POA: Diagnosis not present

## 2019-02-27 DIAGNOSIS — F411 Generalized anxiety disorder: Secondary | ICD-10-CM | POA: Diagnosis not present

## 2019-05-09 DIAGNOSIS — F4323 Adjustment disorder with mixed anxiety and depressed mood: Secondary | ICD-10-CM | POA: Diagnosis not present

## 2019-08-28 DIAGNOSIS — Z1159 Encounter for screening for other viral diseases: Secondary | ICD-10-CM | POA: Diagnosis not present

## 2019-08-28 DIAGNOSIS — Z118 Encounter for screening for other infectious and parasitic diseases: Secondary | ICD-10-CM | POA: Diagnosis not present

## 2019-08-28 DIAGNOSIS — Z113 Encounter for screening for infections with a predominantly sexual mode of transmission: Secondary | ICD-10-CM | POA: Diagnosis not present

## 2019-08-28 DIAGNOSIS — Z114 Encounter for screening for human immunodeficiency virus [HIV]: Secondary | ICD-10-CM | POA: Diagnosis not present

## 2019-09-24 DIAGNOSIS — Z20822 Contact with and (suspected) exposure to covid-19: Secondary | ICD-10-CM | POA: Diagnosis not present

## 2019-11-12 DIAGNOSIS — Z0000 Encounter for general adult medical examination without abnormal findings: Secondary | ICD-10-CM | POA: Diagnosis not present

## 2019-11-12 DIAGNOSIS — R739 Hyperglycemia, unspecified: Secondary | ICD-10-CM | POA: Diagnosis not present

## 2019-11-13 DIAGNOSIS — E1169 Type 2 diabetes mellitus with other specified complication: Secondary | ICD-10-CM | POA: Diagnosis not present

## 2019-11-13 DIAGNOSIS — Z79899 Other long term (current) drug therapy: Secondary | ICD-10-CM | POA: Diagnosis not present

## 2019-11-13 DIAGNOSIS — R739 Hyperglycemia, unspecified: Secondary | ICD-10-CM | POA: Diagnosis not present

## 2019-11-19 DIAGNOSIS — Z0000 Encounter for general adult medical examination without abnormal findings: Secondary | ICD-10-CM | POA: Diagnosis not present

## 2019-11-19 DIAGNOSIS — R82998 Other abnormal findings in urine: Secondary | ICD-10-CM | POA: Diagnosis not present

## 2019-11-19 DIAGNOSIS — E1169 Type 2 diabetes mellitus with other specified complication: Secondary | ICD-10-CM | POA: Diagnosis not present

## 2020-01-09 DIAGNOSIS — Z7189 Other specified counseling: Secondary | ICD-10-CM | POA: Diagnosis not present

## 2020-01-09 DIAGNOSIS — R52 Pain, unspecified: Secondary | ICD-10-CM | POA: Diagnosis not present

## 2020-01-09 DIAGNOSIS — U071 COVID-19: Secondary | ICD-10-CM | POA: Diagnosis not present

## 2020-01-09 DIAGNOSIS — R059 Cough, unspecified: Secondary | ICD-10-CM | POA: Diagnosis not present

## 2020-03-17 DIAGNOSIS — E1169 Type 2 diabetes mellitus with other specified complication: Secondary | ICD-10-CM | POA: Diagnosis not present

## 2020-06-17 DIAGNOSIS — E1169 Type 2 diabetes mellitus with other specified complication: Secondary | ICD-10-CM | POA: Diagnosis not present

## 2020-06-17 DIAGNOSIS — R42 Dizziness and giddiness: Secondary | ICD-10-CM | POA: Diagnosis not present

## 2020-11-12 ENCOUNTER — Other Ambulatory Visit: Payer: Self-pay | Admitting: Internal Medicine

## 2020-11-12 DIAGNOSIS — R1011 Right upper quadrant pain: Secondary | ICD-10-CM

## 2020-11-13 ENCOUNTER — Ambulatory Visit
Admission: RE | Admit: 2020-11-13 | Discharge: 2020-11-13 | Disposition: A | Discharge status: Home / Self Care | Payer: BC Managed Care – PPO | Source: Ambulatory Visit | Attending: Internal Medicine | Admitting: Internal Medicine

## 2020-11-13 DIAGNOSIS — R1011 Right upper quadrant pain: Secondary | ICD-10-CM

## 2020-11-13 DIAGNOSIS — K7689 Other specified diseases of liver: Secondary | ICD-10-CM | POA: Diagnosis not present

## 2020-11-17 DIAGNOSIS — R519 Headache, unspecified: Secondary | ICD-10-CM | POA: Diagnosis not present

## 2020-11-17 DIAGNOSIS — R0982 Postnasal drip: Secondary | ICD-10-CM | POA: Diagnosis not present

## 2020-11-18 DIAGNOSIS — D649 Anemia, unspecified: Secondary | ICD-10-CM | POA: Diagnosis not present

## 2020-11-18 DIAGNOSIS — R1084 Generalized abdominal pain: Secondary | ICD-10-CM | POA: Diagnosis not present

## 2020-11-19 ENCOUNTER — Telehealth: Payer: Self-pay | Admitting: Internal Medicine

## 2020-11-19 ENCOUNTER — Other Ambulatory Visit: Payer: Self-pay | Admitting: Internal Medicine

## 2020-11-19 ENCOUNTER — Ambulatory Visit
Admission: RE | Admit: 2020-11-19 | Discharge: 2020-11-19 | Disposition: A | Discharge status: Home / Self Care | Payer: BC Managed Care – PPO | Source: Ambulatory Visit | Attending: Internal Medicine | Admitting: Internal Medicine

## 2020-11-19 DIAGNOSIS — R1084 Generalized abdominal pain: Secondary | ICD-10-CM

## 2020-11-19 DIAGNOSIS — K7689 Other specified diseases of liver: Secondary | ICD-10-CM | POA: Diagnosis not present

## 2020-11-19 DIAGNOSIS — K429 Umbilical hernia without obstruction or gangrene: Secondary | ICD-10-CM | POA: Diagnosis not present

## 2020-11-19 MED ORDER — IOPAMIDOL (ISOVUE-300) INJECTION 61%
100.0000 mL | Freq: Once | INTRAVENOUS | Status: AC | PRN
  Administered 2020-11-19: 100 mL via INTRAVENOUS

## 2020-11-19 NOTE — Telephone Encounter (Signed)
Representative from [facility] office called the schedule pt's new hem appt. Referral was faxed over, I scheduled the appt. Referring office will let pt know appt date and time and where to come.

## 2020-11-20 ENCOUNTER — Encounter: Payer: Self-pay | Admitting: Internal Medicine

## 2020-11-20 ENCOUNTER — Inpatient Hospital Stay: Payer: BC Managed Care – PPO

## 2020-11-20 ENCOUNTER — Other Ambulatory Visit: Payer: Self-pay | Admitting: Internal Medicine

## 2020-11-20 ENCOUNTER — Other Ambulatory Visit: Payer: Self-pay

## 2020-11-20 ENCOUNTER — Inpatient Hospital Stay: Payer: BC Managed Care – PPO | Attending: Internal Medicine | Admitting: Internal Medicine

## 2020-11-20 VITALS — BP 116/89 | HR 117 | Temp 98.6°F | Resp 20 | Ht 64.0 in | Wt 217.6 lb

## 2020-11-20 DIAGNOSIS — N920 Excessive and frequent menstruation with regular cycle: Secondary | ICD-10-CM | POA: Insufficient documentation

## 2020-11-20 DIAGNOSIS — E119 Type 2 diabetes mellitus without complications: Secondary | ICD-10-CM | POA: Insufficient documentation

## 2020-11-20 DIAGNOSIS — D509 Iron deficiency anemia, unspecified: Secondary | ICD-10-CM

## 2020-11-20 DIAGNOSIS — D500 Iron deficiency anemia secondary to blood loss (chronic): Secondary | ICD-10-CM | POA: Insufficient documentation

## 2020-11-20 DIAGNOSIS — Z79899 Other long term (current) drug therapy: Secondary | ICD-10-CM | POA: Insufficient documentation

## 2020-11-20 DIAGNOSIS — D539 Nutritional anemia, unspecified: Secondary | ICD-10-CM

## 2020-11-20 LAB — CMP (CANCER CENTER ONLY)
ALT: 24 U/L (ref 0–44)
AST: 15 U/L (ref 15–41)
Albumin: 4 g/dL (ref 3.5–5.0)
Alkaline Phosphatase: 64 U/L (ref 38–126)
Anion gap: 11 (ref 5–15)
BUN: 9 mg/dL (ref 6–20)
CO2: 23 mmol/L (ref 22–32)
Calcium: 9.2 mg/dL (ref 8.9–10.3)
Chloride: 106 mmol/L (ref 98–111)
Creatinine: 0.75 mg/dL (ref 0.44–1.00)
GFR, Estimated: >60 mL/min (ref >60)
Glucose, Bld: 131 mg/dL — ABNORMAL HIGH (ref 70–99)
Potassium: 3.7 mmol/L (ref 3.5–5.1)
Sodium: 140 mmol/L (ref 135–145)
Total Bilirubin: 0.3 mg/dL (ref 0.3–1.2)
Total Protein: 8 g/dL (ref 6.5–8.1)

## 2020-11-20 LAB — CBC WITH DIFFERENTIAL (CANCER CENTER ONLY)
Abs Immature Granulocytes: 0.01 10*3/uL (ref 0.00–0.07)
Basophils Absolute: 0 10*3/uL (ref 0.0–0.1)
Basophils Relative: 1 %
Eosinophils Absolute: 0.1 10*3/uL (ref 0.0–0.5)
Eosinophils Relative: 3 %
HCT: 31 % — ABNORMAL LOW (ref 36.0–46.0)
Hemoglobin: 8.7 g/dL — ABNORMAL LOW (ref 12.0–15.0)
Immature Granulocytes: 0 %
Lymphocytes Relative: 32 %
Lymphs Abs: 1.6 10*3/uL (ref 0.7–4.0)
MCH: 20.1 pg — ABNORMAL LOW (ref 26.0–34.0)
MCHC: 28.1 g/dL — ABNORMAL LOW (ref 30.0–36.0)
MCV: 71.8 fL — ABNORMAL LOW (ref 80.0–100.0)
Monocytes Absolute: 0.3 10*3/uL (ref 0.1–1.0)
Monocytes Relative: 5 %
Neutro Abs: 3 10*3/uL (ref 1.7–7.7)
Neutrophils Relative %: 59 %
Platelet Count: 506 10*3/uL — ABNORMAL HIGH (ref 150–400)
RBC: 4.32 MIL/uL (ref 3.87–5.11)
RDW: 18.7 % — ABNORMAL HIGH (ref 11.5–15.5)
WBC Count: 5 10*3/uL (ref 4.0–10.5)
nRBC: 0 % (ref 0.0–0.2)

## 2020-11-20 LAB — IRON AND TIBC
Iron: 16 ug/dL — ABNORMAL LOW (ref 41–142)
Saturation Ratios: 3 % — ABNORMAL LOW (ref 21–57)
TIBC: 459 ug/dL — ABNORMAL HIGH (ref 236–444)
UIBC: 443 ug/dL — ABNORMAL HIGH (ref 120–384)

## 2020-11-20 LAB — FERRITIN: Ferritin: <4 ng/mL — ABNORMAL LOW (ref 11–307)

## 2020-11-20 LAB — LACTATE DEHYDROGENASE: LDH: 175 U/L (ref 98–192)

## 2020-11-20 NOTE — Progress Notes (Signed)
[facility] [phone]   Fax:([phone]  CONSULT NOTE  REFERRING PHYSICIAN: Dr. Alysia Penna  REASON FOR CONSULTATION:  25 years old white female for evaluation of persistent anemia.  HPI Becky Armstrong is a 25 y.o. female with no significant past medical history except diabetes mellitus and she is currently on treatment with metformin.  The patient was seen recently by her primary care physician for evaluation and during her blood work she had repeat CBC that she has no history of anemia in the past and yesterday was the first time that she had about her anemia.  She has been feeling fine with no concerning complaints.  She denied having any dizzy spells.  She has no bleeding issues but her minister.  His irregular and sometimes lost more than 7 days in the past but not recently.  Her periods are still irregular and sometimes comes every 2 months but there was clot.  She denied having any bleeding, bruises or ecchymosis.  She has no current nausea, vomiting, diarrhea or constipation.  She denied having any chest pain, shortness of breath, cough or hemoptysis.  She has occasional headache with no visual changes.  She denied having any significant weight loss or night sweats. Family history significant for grandmother with iron deficiency anemia.  Father is healthy and mother had diabetes. The patient is single and has no children.  She works in a Training and development officer.  She has no history of smoking, alcohol or drug abuse.  HPI  No past medical history on file.  No past surgical history on file.  No family history on file.  Social History Social History   Tobacco Use   Smoking status: Never   Smokeless tobacco: Never    No Known Allergies  Current Outpatient Medications  Medication Sig Dispense Refill   albuterol (PROVENTIL HFA;VENTOLIN HFA) 108 (90 Base) MCG/ACT inhaler Inhale 1-2 puffs into the lungs every 6 (six) hours as needed for wheezing  or shortness of breath. 1 Inhaler 0   fexofenadine (ALLEGRA) 180 MG tablet Take one by mouth daily     metFORMIN (GLUCOPHAGE) 500 MG tablet 1 tablet with a meal     No current facility-administered medications for this visit.    Review of Systems  Constitutional: negative Eyes: negative Ears, nose, mouth, throat, and face: negative Respiratory: negative Cardiovascular: negative Gastrointestinal: negative Genitourinary:negative Integument/breast: negative Hematologic/lymphatic: negative Musculoskeletal:negative Neurological: negative Behavioral/Psych: negative Endocrine: negative Allergic/Immunologic: negative  Physical Exam  RSW:NIOEV, healthy, no distress, well nourished, and well developed SKIN: skin color, texture, turgor are normal, no rashes or significant lesions HEAD: Normocephalic, No masses, lesions, tenderness or abnormalities EYES: normal, PERRLA EARS: External ears normal, Canals clear OROPHARYNX:no exudate, no erythema, and lips, buccal mucosa, and tongue normal  NECK: supple, no adenopathy, no JVD LYMPH:  no palpable lymphadenopathy, no hepatosplenomegaly BREAST:not examined LUNGS: clear to auscultation , and palpation HEART: regular rate & rhythm, no murmurs, and no gallops ABDOMEN:abdomen soft, non-tender, obese, normal bowel sounds, and no masses or organomegaly BACK: Back symmetric, no curvature., No CVA tenderness EXTREMITIES:no joint deformities, effusion, or inflammation, no edema  NEURO: alert & oriented x 3 with fluent speech, no focal motor/sensory deficits  PERFORMANCE STATUS: ECOG 0  LABORATORY DATA: Lab Results  Component Value Date   WBC 5.0 11/20/2020   HGB 8.7 (L) 11/20/2020   HCT 31.0 (L) 11/20/2020   MCV 71.8 (L) 11/20/2020   PLT 506 (H) 11/20/2020  Chemistry      Component Value Date/Time   NA 140 11/20/2020 1107   K 3.7 11/20/2020 1107   CL 106 11/20/2020 1107   CO2 23 11/20/2020 1107   BUN 9 11/20/2020 1107    CREATININE 0.75 11/20/2020 1107      Component Value Date/Time   CALCIUM 9.2 11/20/2020 1107   ALKPHOS 64 11/20/2020 1107   AST 15 11/20/2020 1107   ALT 24 11/20/2020 1107   BILITOT 0.3 11/20/2020 1107       RADIOGRAPHIC STUDIES: CT ABDOMEN PELVIS W CONTRAST  Result Date: 11/19/2020 CLINICAL DATA:  Abdominal pain, right upper quadrant pain for 3 weeks EXAM: CT ABDOMEN AND PELVIS WITH CONTRAST TECHNIQUE: Multidetector CT imaging of the abdomen and pelvis was performed using the standard protocol following bolus administration of intravenous contrast. CONTRAST:  ISOVUE-300 IOPAMIDOL (ISOVUE-300) INJECTION 61% COMPARISON:  Ultrasound 11/13/2020 FINDINGS: Lower chest: Included lung bases are clear.  Heart size is normal. Hepatobiliary: Mildly decreased attenuation of the hepatic parenchyma. No focal liver abnormality. Unremarkable gallbladder. No hyperdense gallstone. No biliary dilatation. Pancreas: Unremarkable. No pancreatic ductal dilatation or surrounding inflammatory changes. Spleen: Normal in size without focal abnormality. Adrenals/Urinary Tract: Unremarkable adrenal glands. Kidneys enhance symmetrically without focal lesion, stone, or hydronephrosis. Ureters are nondilated. Urinary bladder is decompressed, limiting its evaluation. Stomach/Bowel: Stomach is within normal limits. Appendix appears normal (series 2, image 61). No evidence of bowel wall thickening, distention, or inflammatory changes. Vascular/Lymphatic: No significant vascular findings are present. No enlarged abdominal or pelvic lymph nodes. Reproductive: Uterus and bilateral adnexa are unremarkable. Other: No free fluid. No abdominopelvic fluid collection. No pneumoperitoneum. Tiny fat containing umbilical hernia. Musculoskeletal: No acute or significant osseous findings. IMPRESSION: 1. No acute abdominopelvic findings. 2. Mildly decreased attenuation of the hepatic parenchyma suggestive of hepatic steatosis. 3. Tiny fat  containing umbilical hernia. Electronically Signed   By: Duanne Guess D.O.   On: 11/19/2020 12:26   US Abdomen Limited RUQ (LIVER/GB)  Result Date: 11/13/2020 CLINICAL DATA:  RIGHT upper quadrant pain EXAM: ULTRASOUND ABDOMEN LIMITED RIGHT UPPER QUADRANT COMPARISON:  None. FINDINGS: Gallbladder: No gallstones or wall thickening visualized. No sonographic Murphy sign noted by sonographer. Common bile duct: Diameter: Upper limits of normal at 5 mm. Liver: Marked increase in hepatic echogenicity. No focal lesion. No biliary duct dilatation. Portal vein is patent on color Doppler imaging with normal direction of blood flow towards the liver. Other: Ascites IMPRESSION: 1. Normal gallbladder. 2. Increased liver echogenicity commonly represents hepatic steatosis. Electronically Signed   By: Genevive Bi M.D.   On: 11/13/2020 09:27    ASSESSMENT: This is a very pleasant 25 years old white female with microcytic anemia secondary to iron deficiency from menorrhagia.   PLAN: I had a lengthy discussion with the patient and her mother today about her current condition and treatment options. Repeat CBC today showed persistent anemia with hemoglobin of 8.7 and hematocrit 31.0%.  Her MCV is 71.8%.  Her CBC 2 years ago showed hemoglobin of 13.9 hematocrit of 40.8%. Iron study today showed serum iron of 16, total iron binding capacity 459 with iron saturation of 3%.  Ferritin level was less than 4. The patient has not been on any iron supplements recently. I recommended for her to start treatment with oral iron tablets with Integra +1 capsule p.o. daily for the next few months.  I will see her back for follow-up visit in 3 months for evaluation with repeat CBC, iron study and ferritin. If she  continues to have persistent iron deficiency and anemia on the oral iron tablet, I will consider her for iron infusion. The patient declined the presence of any rectal bleeding or black tarry stool.  There is also no  strong family history of colorectal cancer. She was advised to call immediately if she has any other concerning symptoms in the interval. The patient voices understanding of current disease status and treatment options and is in agreement with the current care plan.  All questions were answered. The patient knows to call the clinic with any problems, questions or concerns. We can certainly see the patient much sooner if necessary.  Thank you so much for allowing me to participate in the care of Becky Armstrong. I will continue to follow up the patient with you and assist in her care.  The total time spent in the appointment was 60 minutes.  Disclaimer: This note was dictated with voice recognition software. Similar sounding words can inadvertently be transcribed and may not be corrected upon review.   Lajuana Matte November 20, 2020, 12:13 PM

## 2020-11-21 ENCOUNTER — Other Ambulatory Visit: Payer: Self-pay

## 2020-11-21 DIAGNOSIS — D500 Iron deficiency anemia secondary to blood loss (chronic): Secondary | ICD-10-CM

## 2020-11-21 MED ORDER — INTEGRA PLUS PO CAPS: 1.0000 | ORAL_CAPSULE | Freq: Every day | ORAL | 1 refills | Status: DC

## 2020-12-02 DIAGNOSIS — E1169 Type 2 diabetes mellitus with other specified complication: Secondary | ICD-10-CM | POA: Diagnosis not present

## 2020-12-09 DIAGNOSIS — D649 Anemia, unspecified: Secondary | ICD-10-CM | POA: Diagnosis not present

## 2020-12-09 DIAGNOSIS — E1169 Type 2 diabetes mellitus with other specified complication: Secondary | ICD-10-CM | POA: Diagnosis not present

## 2020-12-09 DIAGNOSIS — Z0000 Encounter for general adult medical examination without abnormal findings: Secondary | ICD-10-CM | POA: Diagnosis not present

## 2021-02-28 ENCOUNTER — Other Ambulatory Visit: Payer: Self-pay | Admitting: Internal Medicine

## 2021-02-28 DIAGNOSIS — D500 Iron deficiency anemia secondary to blood loss (chronic): Secondary | ICD-10-CM

## 2021-03-31 ENCOUNTER — Other Ambulatory Visit: Payer: Self-pay | Admitting: Internal Medicine

## 2021-03-31 DIAGNOSIS — D500 Iron deficiency anemia secondary to blood loss (chronic): Secondary | ICD-10-CM

## 2021-04-30 ENCOUNTER — Other Ambulatory Visit: Payer: Self-pay | Admitting: Internal Medicine

## 2021-04-30 DIAGNOSIS — D500 Iron deficiency anemia secondary to blood loss (chronic): Secondary | ICD-10-CM

## 2021-05-03 ENCOUNTER — Other Ambulatory Visit: Payer: Self-pay | Admitting: Internal Medicine

## 2021-05-03 DIAGNOSIS — D500 Iron deficiency anemia secondary to blood loss (chronic): Secondary | ICD-10-CM

## 2021-05-04 ENCOUNTER — Ambulatory Visit (HOSPITAL_BASED_OUTPATIENT_CLINIC_OR_DEPARTMENT_OTHER)
Admission: RE | Admit: 2021-05-04 | Discharge: 2021-05-04 | Disposition: A | Discharge status: Home / Self Care | Payer: BC Managed Care – PPO | Source: Ambulatory Visit | Attending: Internal Medicine | Admitting: Internal Medicine

## 2021-05-04 ENCOUNTER — Other Ambulatory Visit (HOSPITAL_BASED_OUTPATIENT_CLINIC_OR_DEPARTMENT_OTHER): Payer: Self-pay | Admitting: Internal Medicine

## 2021-05-04 ENCOUNTER — Other Ambulatory Visit: Payer: Self-pay | Admitting: Internal Medicine

## 2021-05-04 ENCOUNTER — Encounter (HOSPITAL_BASED_OUTPATIENT_CLINIC_OR_DEPARTMENT_OTHER): Payer: Self-pay

## 2021-05-04 DIAGNOSIS — N1330 Unspecified hydronephrosis: Secondary | ICD-10-CM | POA: Diagnosis not present

## 2021-05-04 DIAGNOSIS — N134 Hydroureter: Secondary | ICD-10-CM | POA: Diagnosis not present

## 2021-05-04 DIAGNOSIS — R109 Unspecified abdominal pain: Secondary | ICD-10-CM | POA: Insufficient documentation

## 2021-05-04 DIAGNOSIS — R1031 Right lower quadrant pain: Secondary | ICD-10-CM | POA: Diagnosis not present

## 2021-05-04 LAB — POCT I-STAT CREATININE: Creatinine, Ser: 0.7 mg/dL (ref 0.44–1.00)

## 2021-05-04 MED ORDER — IOHEXOL 300 MG/ML  SOLN
100.0000 mL | Freq: Once | INTRAMUSCULAR | Status: AC | PRN
  Administered 2021-05-04: 85 mL via INTRAVENOUS

## 2021-05-08 DIAGNOSIS — N201 Calculus of ureter: Secondary | ICD-10-CM | POA: Diagnosis not present

## 2021-05-22 DIAGNOSIS — H52203 Unspecified astigmatism, bilateral: Secondary | ICD-10-CM | POA: Diagnosis not present

## 2021-05-22 DIAGNOSIS — H5213 Myopia, bilateral: Secondary | ICD-10-CM | POA: Diagnosis not present

## 2021-05-22 DIAGNOSIS — E119 Type 2 diabetes mellitus without complications: Secondary | ICD-10-CM | POA: Diagnosis not present

## 2021-06-02 ENCOUNTER — Other Ambulatory Visit: Payer: Self-pay | Admitting: Physician Assistant

## 2021-06-02 DIAGNOSIS — D500 Iron deficiency anemia secondary to blood loss (chronic): Secondary | ICD-10-CM

## 2021-06-02 DIAGNOSIS — N201 Calculus of ureter: Secondary | ICD-10-CM | POA: Diagnosis not present

## 2021-06-03 ENCOUNTER — Telehealth: Payer: Self-pay | Admitting: Physician Assistant

## 2021-06-03 ENCOUNTER — Telehealth: Payer: Self-pay

## 2021-06-03 NOTE — Telephone Encounter (Signed)
Pt LM wanting to know why she needs to be seen again for her rx refill.  I have called her back and she advised she prefers to f/u w/her PCP for lab recheck & understands she will need to get her iron rx refills from her PCP going forward. She understands she is welcome to r/s with [place] should she find she needs our services again.  I have cancelled her follow-up appts here in the [facility] per her request.

## 2021-06-03 NOTE — Telephone Encounter (Signed)
.  Called patient to schedule appointment per 5/23 inbasket, left msg for pt

## 2021-06-10 DIAGNOSIS — D649 Anemia, unspecified: Secondary | ICD-10-CM | POA: Diagnosis not present

## 2021-06-16 DIAGNOSIS — R739 Hyperglycemia, unspecified: Secondary | ICD-10-CM | POA: Diagnosis not present

## 2021-06-27 ENCOUNTER — Other Ambulatory Visit: Payer: Self-pay | Admitting: Physician Assistant

## 2021-06-27 DIAGNOSIS — D500 Iron deficiency anemia secondary to blood loss (chronic): Secondary | ICD-10-CM

## 2021-06-29 ENCOUNTER — Other Ambulatory Visit: Payer: Self-pay | Admitting: Physician Assistant

## 2021-06-29 ENCOUNTER — Ambulatory Visit: Payer: BC Managed Care – PPO | Admitting: Physician Assistant

## 2021-06-29 ENCOUNTER — Other Ambulatory Visit: Payer: BC Managed Care – PPO

## 2021-06-29 DIAGNOSIS — D500 Iron deficiency anemia secondary to blood loss (chronic): Secondary | ICD-10-CM

## 2021-06-29 MED ORDER — INTEGRA PLUS PO CAPS: 1.0000 | ORAL_CAPSULE | Freq: Every day | ORAL | 2 refills | Status: AC

## 2021-07-22 DIAGNOSIS — R1031 Right lower quadrant pain: Secondary | ICD-10-CM | POA: Diagnosis not present

## 2021-07-22 DIAGNOSIS — R1084 Generalized abdominal pain: Secondary | ICD-10-CM | POA: Diagnosis not present

## 2021-07-22 DIAGNOSIS — R109 Unspecified abdominal pain: Secondary | ICD-10-CM | POA: Diagnosis not present

## 2021-07-22 DIAGNOSIS — Z87442 Personal history of urinary calculi: Secondary | ICD-10-CM | POA: Diagnosis not present

## 2021-07-22 DIAGNOSIS — N201 Calculus of ureter: Secondary | ICD-10-CM | POA: Diagnosis not present

## 2021-09-08 DIAGNOSIS — D649 Anemia, unspecified: Secondary | ICD-10-CM | POA: Diagnosis not present

## 2021-11-03 DIAGNOSIS — E1169 Type 2 diabetes mellitus with other specified complication: Secondary | ICD-10-CM | POA: Diagnosis not present

## 2021-11-03 DIAGNOSIS — R051 Acute cough: Secondary | ICD-10-CM | POA: Diagnosis not present

## 2021-11-03 DIAGNOSIS — R0981 Nasal congestion: Secondary | ICD-10-CM | POA: Diagnosis not present

## 2021-12-10 DIAGNOSIS — E1169 Type 2 diabetes mellitus with other specified complication: Secondary | ICD-10-CM | POA: Diagnosis not present

## 2021-12-10 DIAGNOSIS — R739 Hyperglycemia, unspecified: Secondary | ICD-10-CM | POA: Diagnosis not present

## 2021-12-15 DIAGNOSIS — E1169 Type 2 diabetes mellitus with other specified complication: Secondary | ICD-10-CM | POA: Diagnosis not present

## 2021-12-16 DIAGNOSIS — Z1331 Encounter for screening for depression: Secondary | ICD-10-CM | POA: Diagnosis not present

## 2021-12-16 DIAGNOSIS — Z1339 Encounter for screening examination for other mental health and behavioral disorders: Secondary | ICD-10-CM | POA: Diagnosis not present

## 2021-12-16 DIAGNOSIS — I498 Other specified cardiac arrhythmias: Secondary | ICD-10-CM | POA: Diagnosis not present

## 2021-12-16 DIAGNOSIS — Z0000 Encounter for general adult medical examination without abnormal findings: Secondary | ICD-10-CM | POA: Diagnosis not present

## 2021-12-16 DIAGNOSIS — R82998 Other abnormal findings in urine: Secondary | ICD-10-CM | POA: Diagnosis not present

## 2021-12-18 NOTE — Progress Notes (Signed)
Referring-Scott Holwerda MD Reason for referral-palpitations  HPI: 26 year old female for evaluation of palpitations at request of Alysia Penna MD. Patient states that since mid October she has had intermittent palpitations.  They are described as her heart pounding.  They are not related to activities.  Typically last approximately 10 minutes and resolve spontaneously.  She notes mild dizziness but has had no syncope.  There is mild associated chest tightness and dyspnea.  She otherwise denies significant dyspnea on exertion, orthopnea, PND, pedal edema, exertional chest pain and no history of syncope.  Cardiology now asked to evaluate.  Current Outpatient Medications  Medication Sig Dispense Refill   albuterol (PROVENTIL HFA;VENTOLIN HFA) 108 (90 Base) MCG/ACT inhaler Inhale 1-2 puffs into the lungs every 6 (six) hours as needed for wheezing or shortness of breath. 1 Inhaler 0   FeFum-FePoly-FA-B Cmp-C-Biot (INTEGRA PLUS) CAPS Take 1 capsule by mouth daily. 30 capsule 2   fexofenadine (ALLEGRA) 180 MG tablet Take one by mouth daily     metFORMIN (GLUCOPHAGE) 500 MG tablet 1 tablet with a meal     No current facility-administered medications for this visit.    Allergies  Allergen Reactions   Azithromycin     Other Reaction(s): anxiety, tingling     Past Medical History:  Diagnosis Date   Asthma 2006   Diabetes mellitus without complication (HCC) November 2021   Nephrolithiasis     Past Surgical History:  Procedure Laterality Date   No previous surgery      Social History   Socioeconomic History   Marital status: Single    Spouse name: Not on file   Number of children: Not on file   Years of education: Not on file   Highest education level: Not on file  Occupational History   Not on file  Tobacco Use   Smoking status: Never   Smokeless tobacco: Never   Tobacco comments:    N/A  Substance and Sexual Activity   Alcohol use: Not Currently    Comment: Have  previously, but havent drank in years   Drug use: Never   Sexual activity: Yes    Birth control/protection: Abstinence, Condom  Other Topics Concern   Not on file  Social History Narrative   Not on file   Social Determinants of Health   Financial Resource Strain: Not on file  Food Insecurity: Not on file  Transportation Needs: Not on file  Physical Activity: Not on file  Stress: Not on file  Social Connections: Not on file  Intimate Partner Violence: Not on file    Family History  Problem Relation Age of Onset   Anemia Maternal Grandmother    Asthma Maternal Grandmother    Clotting disorder Maternal Grandmother    Heart disease Maternal Grandmother    Heart disease Maternal Grandfather     ROS: no fevers or chills, productive cough, hemoptysis, dysphasia, odynophagia, melena, hematochezia, dysuria, hematuria, rash, seizure activity, orthopnea, PND, pedal edema, claudication. Remaining systems are negative.  Physical Exam:   Blood pressure 113/77, pulse 91, height 5\' 4"  (1.626 m), weight 211 lb 12.8 oz (96.1 kg), SpO2 97 %.  General:  Well developed/well nourished in NAD Skin warm/dry Patient not depressed No peripheral clubbing Back-normal HEENT-normal/normal eyelids Neck supple/normal carotid upstroke bilaterally; no bruits; no JVD; no thyromegaly chest - CTA/ normal expansion CV - RRR/normal S1 and S2; no murmurs, rubs or gallops;  PMI nondisplaced Abdomen -NT/ND, no HSM, no mass, + bowel sounds, no bruit  2+ femoral pulses, no bruits Ext-no edema, chords, 2+ DP Neuro-grossly nonfocal  ECG -normal sinus rhythm at a rate of 91, normal axis, nonspecific ST changes.  Personally reviewed  A/P  1 palpitations-etiology unclear.  We will place a 2-week Zio patch.  Will also arrange an echocardiogram to assess LV function.  Note she also has an Scientist, physiological and she will record any rhythm strips associated with her symptoms.  May need beta-blockade in the future pending  symptoms and results of monitor.  2 chest pain-mild chest tightness with palpitations but otherwise no exertional chest pain.  No plans for further ischemia evaluation.  Olga Millers, MD

## 2021-12-28 ENCOUNTER — Ambulatory Visit: Payer: BC Managed Care – PPO | Attending: Cardiology

## 2021-12-28 ENCOUNTER — Ambulatory Visit: Payer: BC Managed Care – PPO | Attending: Cardiology | Admitting: Cardiology

## 2021-12-28 ENCOUNTER — Encounter: Payer: Self-pay | Admitting: Cardiology

## 2021-12-28 VITALS — BP 113/77 | HR 91 | Ht 64.0 in | Wt 211.8 lb

## 2021-12-28 DIAGNOSIS — R002 Palpitations: Secondary | ICD-10-CM

## 2021-12-28 DIAGNOSIS — R072 Precordial pain: Secondary | ICD-10-CM

## 2021-12-28 NOTE — Patient Instructions (Signed)
Testing/Procedures:  Your physician has requested that you have an echocardiogram. Echocardiography is a painless test that uses sound waves to create images of your heart. It provides your doctor with information about the size and shape of your heart and how well your heart's chambers and valves are working. This procedure takes approximately one hour. There are no restrictions for this procedure. Please do NOT wear cologne, perfume, aftershave, or lotions (deodorant is allowed). Please arrive 15 minutes prior to your appointment time. [place] Instructions  Your physician has requested you wear a ZIO patch monitor for 14 days.  This is a single patch monitor. [employer] supplies one patch monitor per enrollment. Additional stickers are not available. Please do not apply patch if you will be having a Nuclear Stress Test,  Echocardiogram, Cardiac CT, MRI, or Chest Xray during the period you would be wearing the  monitor. The patch cannot be worn during these tests. You cannot remove and re-apply the  ZIO XT patch monitor.  Your ZIO patch monitor will be mailed 3 day USPS to your address on file. It may take 3-5 days  to receive your monitor after you have been enrolled.  Once you have received your monitor, please review the enclosed instructions. Your monitor  has already been registered assigning a specific monitor serial # to you.  Billing and Patient Assistance Program Information  We have supplied [employer] with any of your insurance information on file for billing purposes. [employer] offers a sliding scale Patient Assistance Program for patients that do not have  insurance, or whose insurance does not completely cover the cost of the ZIO monitor.  You must apply for the Patient Assistance Program to qualify for this discounted rate.  To apply, please call [employer] at [phone], select option 4, select option 2, ask to apply for  Patient  Assistance Program. Theodore Demark will ask your household income, and how many people  are in your household. They will quote your out-of-pocket cost based on that information.  [employer] will also be able to set up a 38-month interest-free payment plan if needed.  Applying the monitor   Shave hair from upper left chest.  Hold abrader disc by orange tab. Rub abrader in 40 strokes over the upper left chest as  indicated in your monitor instructions.  Clean area with 4 enclosed alcohol pads. Let dry.  Apply patch as indicated in monitor instructions. Patch will be placed under collarbone on left  side of chest with arrow pointing upward.  Rub patch adhesive wings for 2 minutes. Remove white label marked "1". Remove the white  label marked "2". Rub patch adhesive wings for 2 additional minutes.  While looking in a mirror, press and release button in center of patch. A small green light will  flash 3-4 times. This will be your only indicator that the monitor has been turned on.  Do not shower for the first 24 hours. You may shower after the first 24 hours.  Press the button if you feel a symptom. You will hear a small click. Record Date, Time and  Symptom in the Patient Logbook.  When you are ready to remove the patch, follow instructions on the last 2 pages of Patient  Logbook. Stick patch monitor onto the last page of Patient Logbook.  Place Patient Logbook in the blue and white box. Use locking tab on box and tape box closed  securely. The blue and white  box has prepaid postage on it. Please place it in the mailbox as  soon as possible. Your physician should have your test results approximately 7 days after the  monitor has been mailed back to [facility].  Call [facility] Customer Care at [phone] if you have questions regarding  your ZIO XT patch monitor. Call them immediately if you see an orange light blinking on your  monitor.  If your monitor falls off in less than 4 days,  contact our Monitor department at [phone].  If your monitor becomes loose or falls off after 4 days call [employer] at [phone] for  suggestions on securing your monitor     Follow-Up: At [facility], you and your health needs are our priority.  As part of our continuing mission to provide you with exceptional heart care, we have created designated Provider Care Teams.  These Care Teams include your primary Cardiologist (physician) and Advanced Practice Providers (APPs -  Physician Assistants and Nurse Practitioners) who all work together to provide you with the care you need, when you need it.  We recommend signing up for the patient portal called "MyChart".  Sign up information is provided on this After Visit Summary.  MyChart is used to connect with patients for Virtual Visits (Telemedicine).  Patients are able to view lab/test results, encounter notes, upcoming appointments, etc.  Non-urgent messages can be sent to your provider as well.   To learn more about what you can do with MyChart, go to [email or web address].    Your next appointment:   3 month(s)  The format for your next appointment:   In Person  Provider:   Olga Millers MD

## 2021-12-28 NOTE — Progress Notes (Unsigned)
Enrolled for [employer] to mail a ZIO XT long term holter monitor to [address] [place] [facility] [place], [place], [place]  [address].

## 2021-12-29 ENCOUNTER — Ambulatory Visit (HOSPITAL_COMMUNITY): Payer: BC Managed Care – PPO | Attending: Internal Medicine

## 2021-12-29 DIAGNOSIS — I340 Nonrheumatic mitral (valve) insufficiency: Secondary | ICD-10-CM | POA: Diagnosis not present

## 2021-12-29 DIAGNOSIS — R002 Palpitations: Secondary | ICD-10-CM | POA: Insufficient documentation

## 2021-12-29 LAB — ECHOCARDIOGRAM COMPLETE
Area-P 1/2: 3.42 cm2
S' Lateral: 3.2 cm

## 2021-12-31 DIAGNOSIS — R002 Palpitations: Secondary | ICD-10-CM | POA: Diagnosis not present

## 2022-01-01 ENCOUNTER — Ambulatory Visit: Payer: BC Managed Care – PPO | Admitting: Cardiology

## 2022-01-20 DIAGNOSIS — R002 Palpitations: Secondary | ICD-10-CM | POA: Diagnosis not present

## 2022-02-09 DIAGNOSIS — Z1152 Encounter for screening for COVID-19: Secondary | ICD-10-CM | POA: Diagnosis not present

## 2022-02-09 DIAGNOSIS — H9203 Otalgia, bilateral: Secondary | ICD-10-CM | POA: Diagnosis not present

## 2022-02-09 DIAGNOSIS — E1169 Type 2 diabetes mellitus with other specified complication: Secondary | ICD-10-CM | POA: Diagnosis not present

## 2022-02-09 DIAGNOSIS — R067 Sneezing: Secondary | ICD-10-CM | POA: Diagnosis not present

## 2022-02-09 DIAGNOSIS — G43909 Migraine, unspecified, not intractable, without status migrainosus: Secondary | ICD-10-CM | POA: Diagnosis not present

## 2022-02-09 DIAGNOSIS — R5383 Other fatigue: Secondary | ICD-10-CM | POA: Diagnosis not present

## 2022-02-09 DIAGNOSIS — J0101 Acute recurrent maxillary sinusitis: Secondary | ICD-10-CM | POA: Diagnosis not present

## 2022-02-09 DIAGNOSIS — J45909 Unspecified asthma, uncomplicated: Secondary | ICD-10-CM | POA: Diagnosis not present

## 2022-03-29 NOTE — Progress Notes (Deleted)
     HPI:FU palpitations.  Echocardiogram December 2023 showed normal LV function, mild mitral regurgitation.  Monitor January 2024 showed sinus rhythm with rare PAC and PVC.  Since last seen  Current Outpatient Medications  Medication Sig Dispense Refill   albuterol (PROVENTIL HFA;VENTOLIN HFA) 108 (90 Base) MCG/ACT inhaler Inhale 1-2 puffs into the lungs every 6 (six) hours as needed for wheezing or shortness of breath. 1 Inhaler 0   FeFum-FePoly-FA-B Cmp-C-Biot (INTEGRA PLUS) CAPS Take 1 capsule by mouth daily. 30 capsule 2   fexofenadine (ALLEGRA) 180 MG tablet Take one by mouth daily     metFORMIN (GLUCOPHAGE) 500 MG tablet 1 tablet with a meal     No current facility-administered medications for this visit.     Past Medical History:  Diagnosis Date   Asthma 2006   Diabetes mellitus without complication ([place]) November 2021   Nephrolithiasis     Past Surgical History:  Procedure Laterality Date   No previous surgery      Social History   Socioeconomic History   Marital status: Single    Spouse name: Not on file   Number of children: Not on file   Years of education: Not on file   Highest education level: Not on file  Occupational History   Not on file  Tobacco Use   Smoking status: Never   Smokeless tobacco: Never   Tobacco comments:    N/A  Substance and Sexual Activity   Alcohol use: Not Currently    Comment: Have previously, but havent drank in years   Drug use: Never   Sexual activity: Yes    Birth control/protection: Abstinence, Condom  Other Topics Concern   Not on file  Social History Narrative   Not on file   Social Determinants of Health   Financial Resource Strain: Not on file  Food Insecurity: Not on file  Transportation Needs: Not on file  Physical Activity: Not on file  Stress: Not on file  Social Connections: Not on file  Intimate Partner Violence: Not on file    Family History  Problem Relation Age of Onset   Anemia Maternal  Grandmother    Asthma Maternal Grandmother    Clotting disorder Maternal Grandmother    Heart disease Maternal Grandmother    Heart disease Maternal Grandfather     ROS: no fevers or chills, productive cough, hemoptysis, dysphasia, odynophagia, melena, hematochezia, dysuria, hematuria, rash, seizure activity, orthopnea, PND, pedal edema, claudication. Remaining systems are negative.  Physical Exam: Well-developed well-nourished in no acute distress.  Skin is warm and dry.  HEENT is normal.  Neck is supple.  Chest is clear to auscultation with normal expansion.  Cardiovascular exam is regular rate and rhythm.  Abdominal exam nontender or distended. No masses palpated. Extremities show no edema. neuro grossly intact  ECG- personally reviewed  A/P  1 palpitations-follow-up echocardiogram showed normal LV function.  She has had no increase in symptoms since last office visit.  Will consider addition of beta-blocker in future if necessary.  Kirk Ruths, MD

## 2022-04-02 ENCOUNTER — Ambulatory Visit: Payer: BC Managed Care – PPO | Admitting: Cardiology

## 2022-04-14 DIAGNOSIS — F4323 Adjustment disorder with mixed anxiety and depressed mood: Secondary | ICD-10-CM | POA: Diagnosis not present

## 2022-05-10 ENCOUNTER — Ambulatory Visit: Payer: BC Managed Care – PPO | Admitting: Cardiology

## 2022-05-17 NOTE — Progress Notes (Deleted)
     HPI: FU palpitations.  Echocardiogram December 2023 showed normal LV function, mild mitral regurgitation.  Monitor January 2024 showed sinus rhythm with rare PAC and PVC.  Since last seen  Current Outpatient Medications  Medication Sig Dispense Refill   albuterol (PROVENTIL HFA;VENTOLIN HFA) 108 (90 Base) MCG/ACT inhaler Inhale 1-2 puffs into the lungs every 6 (six) hours as needed for wheezing or shortness of breath. 1 Inhaler 0   FeFum-FePoly-FA-B Cmp-C-Biot (INTEGRA PLUS) CAPS Take 1 capsule by mouth daily. 30 capsule 2   fexofenadine (ALLEGRA) 180 MG tablet Take one by mouth daily     metFORMIN (GLUCOPHAGE) 500 MG tablet 1 tablet with a meal     No current facility-administered medications for this visit.     Past Medical History:  Diagnosis Date   Asthma 2006   Diabetes mellitus without complication ([facility]) November 2021   Nephrolithiasis     Past Surgical History:  Procedure Laterality Date   No previous surgery      Social History   Socioeconomic History   Marital status: Single    Spouse name: Not on file   Number of children: Not on file   Years of education: Not on file   Highest education level: Not on file  Occupational History   Not on file  Tobacco Use   Smoking status: Never   Smokeless tobacco: Never   Tobacco comments:    N/A  Substance and Sexual Activity   Alcohol use: Not Currently    Comment: Have previously, but havent drank in years   Drug use: Never   Sexual activity: Yes    Birth control/protection: Abstinence, Condom  Other Topics Concern   Not on file  Social History Narrative   Not on file   Social Determinants of Health   Financial Resource Strain: Not on file  Food Insecurity: Not on file  Transportation Needs: Not on file  Physical Activity: Not on file  Stress: Not on file  Social Connections: Not on file  Intimate Partner Violence: Not on file    Family History  Problem Relation Age of Onset   Anemia Maternal  Grandmother    Asthma Maternal Grandmother    Clotting disorder Maternal Grandmother    Heart disease Maternal Grandmother    Heart disease Maternal Grandfather     ROS: no fevers or chills, productive cough, hemoptysis, dysphasia, odynophagia, melena, hematochezia, dysuria, hematuria, rash, seizure activity, orthopnea, PND, pedal edema, claudication. Remaining systems are negative.  Physical Exam: Well-developed well-nourished in no acute distress.  Skin is warm and dry.  HEENT is normal.  Neck is supple.  Chest is clear to auscultation with normal expansion.  Cardiovascular exam is regular rate and rhythm.  Abdominal exam nontender or distended. No masses palpated. Extremities show no edema. neuro grossly intact  ECG- personally reviewed  A/P  1 palpitations-LV function is normal and recent monitor did not reveal significant arrhythmias.  Olga Millers, MD

## 2022-05-27 ENCOUNTER — Ambulatory Visit: Payer: Self-pay | Admitting: Cardiology

## 2022-06-04 DIAGNOSIS — E119 Type 2 diabetes mellitus without complications: Secondary | ICD-10-CM | POA: Diagnosis not present

## 2022-06-04 DIAGNOSIS — H5213 Myopia, bilateral: Secondary | ICD-10-CM | POA: Diagnosis not present

## 2022-06-15 DIAGNOSIS — E1169 Type 2 diabetes mellitus with other specified complication: Secondary | ICD-10-CM | POA: Diagnosis not present

## 2022-08-12 DIAGNOSIS — L309 Dermatitis, unspecified: Secondary | ICD-10-CM | POA: Diagnosis not present

## 2022-09-01 DIAGNOSIS — R233 Spontaneous ecchymoses: Secondary | ICD-10-CM | POA: Diagnosis not present

## 2022-09-01 DIAGNOSIS — E1169 Type 2 diabetes mellitus with other specified complication: Secondary | ICD-10-CM | POA: Diagnosis not present

## 2022-09-01 DIAGNOSIS — G43909 Migraine, unspecified, not intractable, without status migrainosus: Secondary | ICD-10-CM | POA: Diagnosis not present

## 2023-03-17 IMAGING — CT CT ABD-PELV W/ CM
2 of 4 series · 16 of 46 positions shown, 18 images · IV contrast (iopamidol)
Comparison: Ultrasound 11/13/2020

CLINICAL DATA: Abdominal pain, right upper quadrant pain for 3
weeks

EXAM:
CT ABDOMEN AND PELVIS WITH CONTRAST
TECHNIQUE: Multidetector CT imaging of the abdomen and pelvis was performed
using the standard protocol following bolus administration of
intravenous contrast.
CONTRAST:  100mL PIBR7X-UGG IOPAMIDOL (PIBR7X-UGG) INJECTION 61%

[Series 2: abd pelvis 5.00 br40 s3 axial · axial · 0.83mm/px · z∈[+1228,+1648]mm · 13 of 92 slices shown, 15 images]
[im 4/92  soft-tissue]
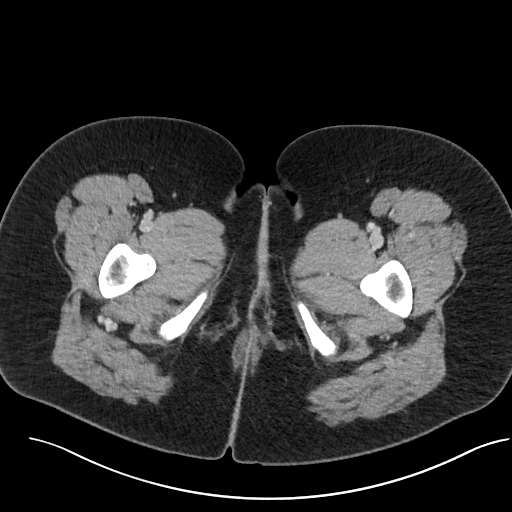
[im 4/92  bone]
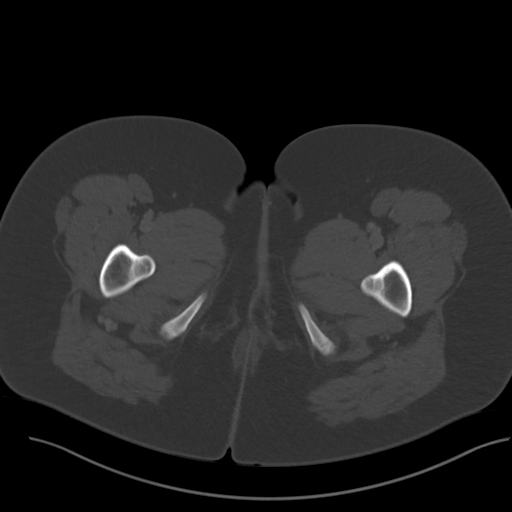
[im 12/92  soft-tissue]
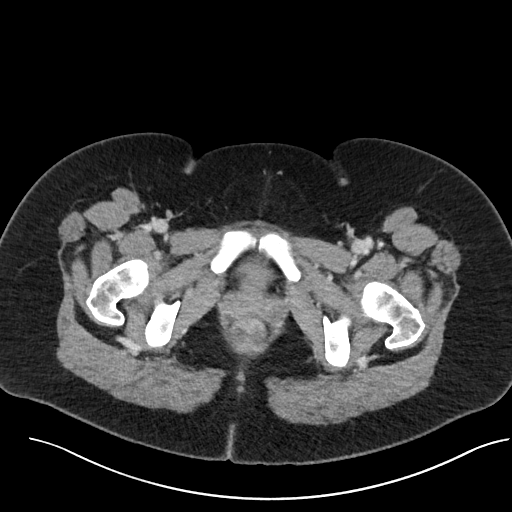
[im 19/92  soft-tissue]
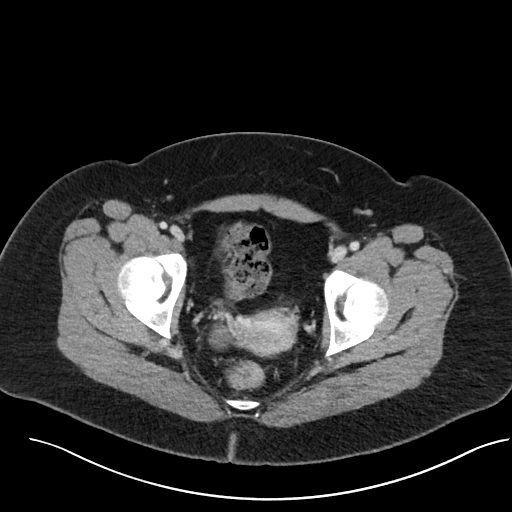
[im 27/92  soft-tissue]
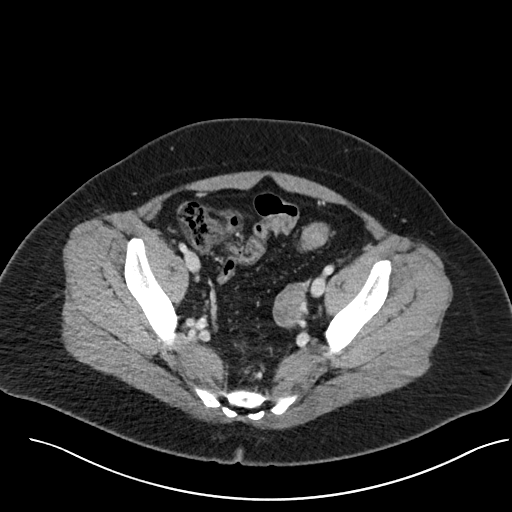
[im 31/92  soft-tissue]
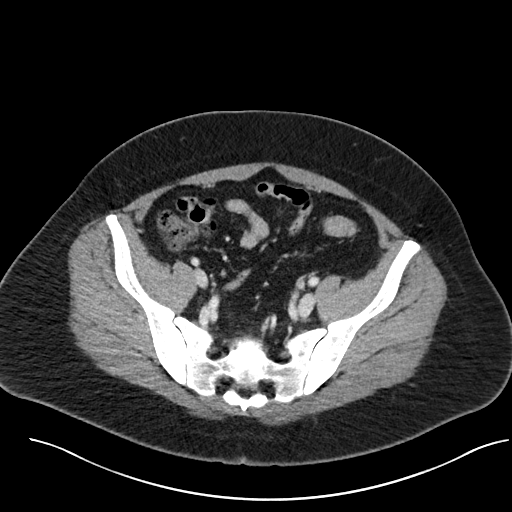
[im 38/92  soft-tissue]
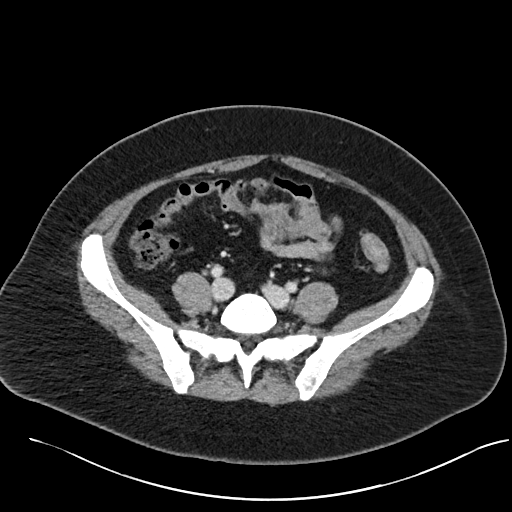
[im 46/92  soft-tissue]
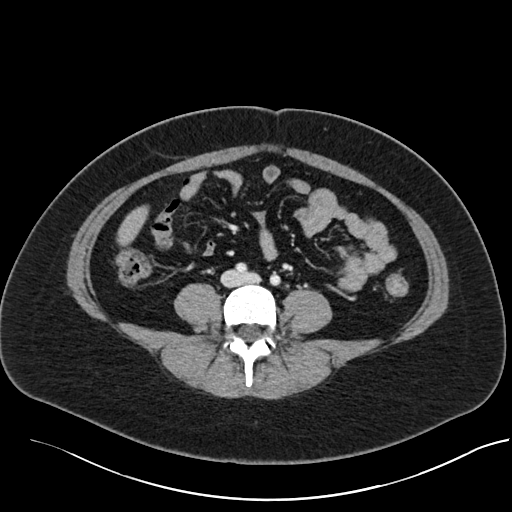
[im 54/92  soft-tissue]
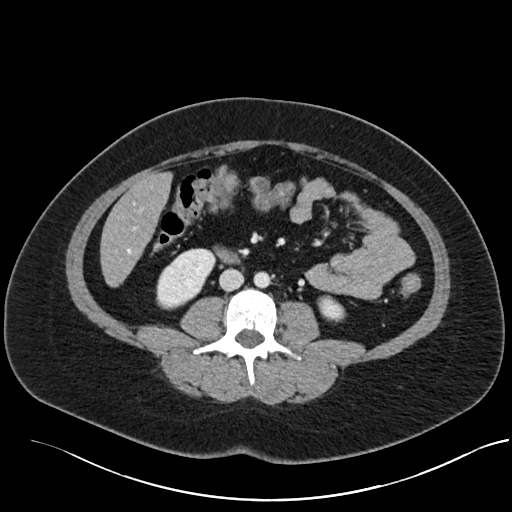
[im 61/92  soft-tissue]
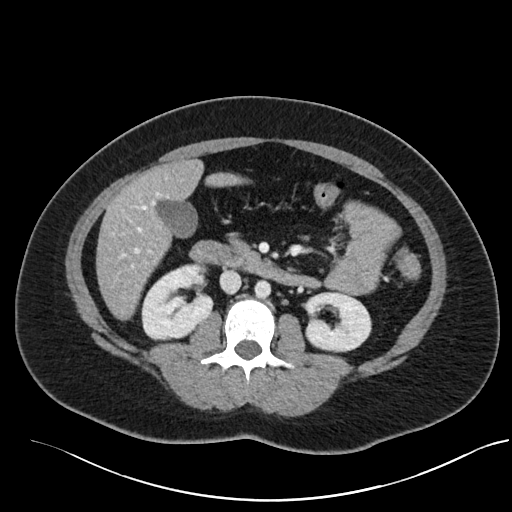
[im 61/92  bone]
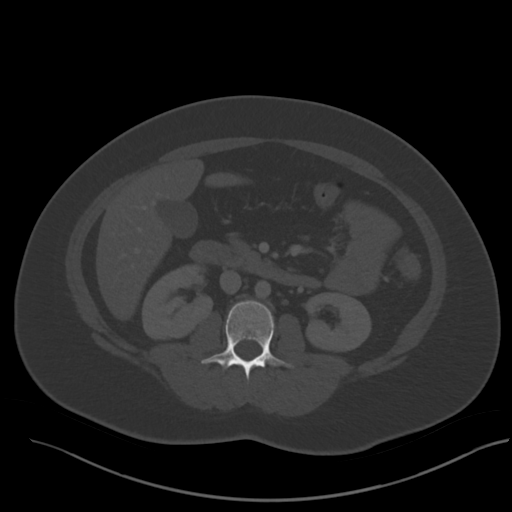
[im 65/92  soft-tissue]
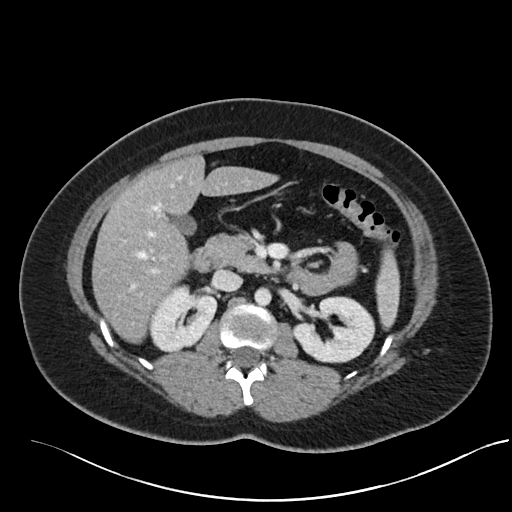
[im 73/92  soft-tissue]
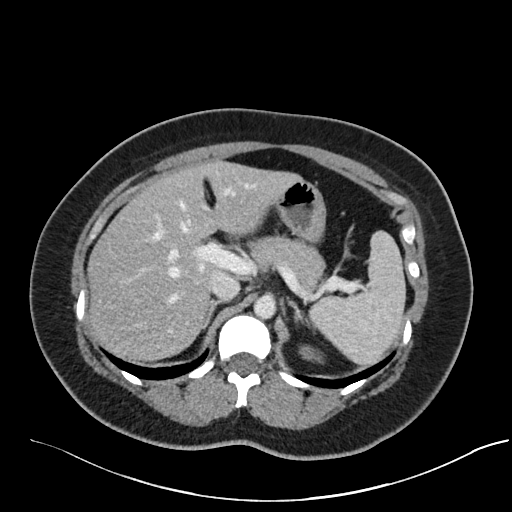
[im 80/92  soft-tissue]
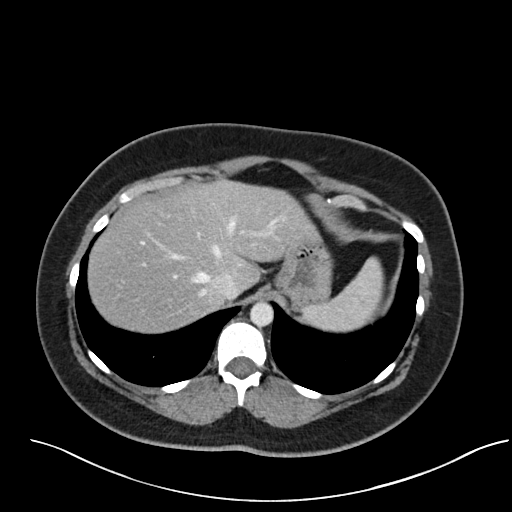
[im 88/92  soft-tissue]
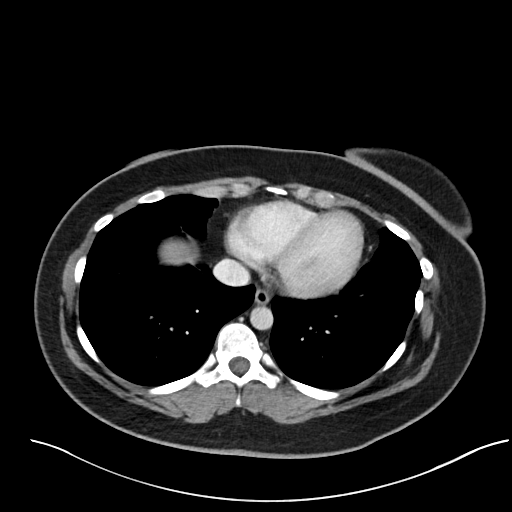

[Series 6: abd pelvis 2.00 br40 s3 cor · coronal · 0.83mm/px · 3 of 211 slices shown]
[im 71/211  soft-tissue]
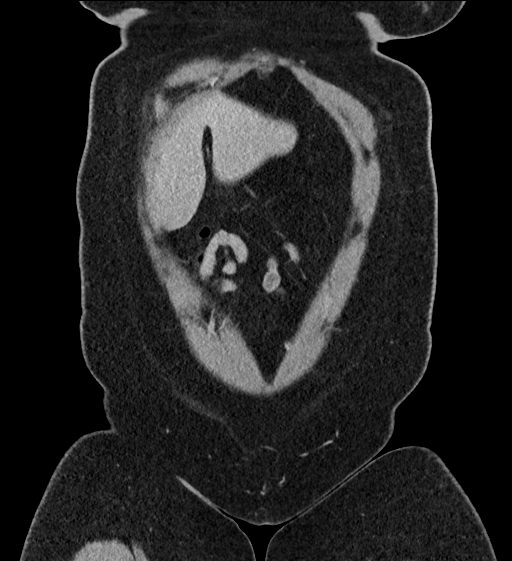
[im 94/211  soft-tissue]
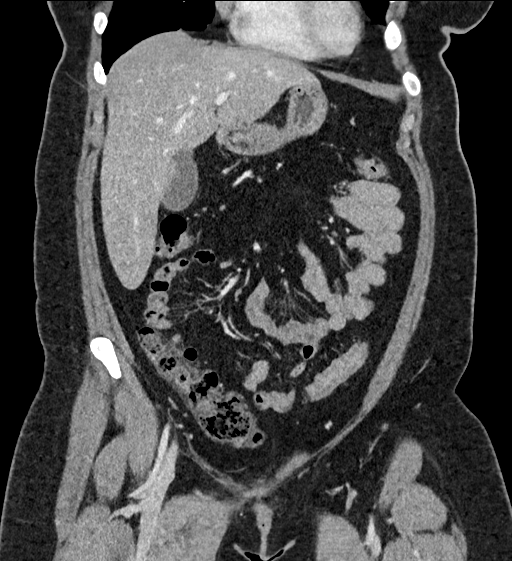
[im 117/211  soft-tissue]
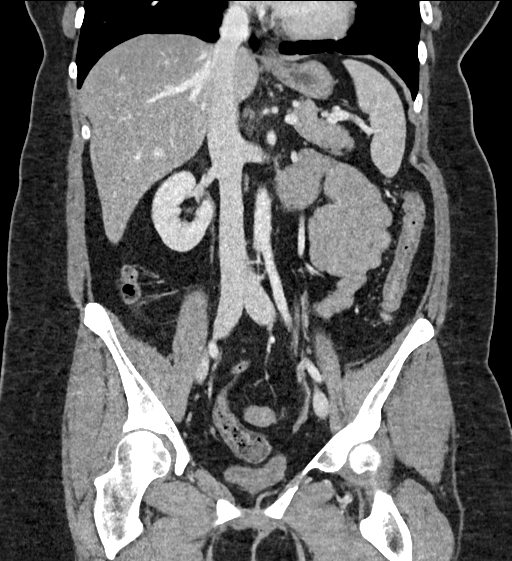

[16 of 46 positions shown; findings below may reference images not displayed]

FINDINGS: Lower chest: Included lung bases are clear.  Heart size is normal.

Hepatobiliary: Mildly decreased attenuation of the hepatic
parenchyma. No focal liver abnormality. Unremarkable gallbladder. No
hyperdense gallstone. No biliary dilatation.

Pancreas: Unremarkable. No pancreatic ductal dilatation or
surrounding inflammatory changes.

Spleen: Normal in size without focal abnormality.

Adrenals/Urinary Tract: Unremarkable adrenal glands. Kidneys enhance
symmetrically without focal lesion, stone, or hydronephrosis.
Ureters are nondilated. Urinary bladder is decompressed, limiting
its evaluation.

Stomach/Bowel: Stomach is within normal limits. Appendix appears
normal (series 2, image 61). No evidence of bowel wall thickening,
distention, or inflammatory changes.

Vascular/Lymphatic: No significant vascular findings are present. No
enlarged abdominal or pelvic lymph nodes.

Reproductive: Uterus and bilateral adnexa are unremarkable.

Other: No free fluid. No abdominopelvic fluid collection. No
pneumoperitoneum. Tiny fat containing umbilical hernia.

Musculoskeletal: No acute or significant osseous findings.
IMPRESSION: 1. No acute abdominopelvic findings.
2. Mildly decreased attenuation of the hepatic parenchyma suggestive
of hepatic steatosis.
3. Tiny fat containing umbilical hernia.

## 2023-08-30 IMAGING — CT CT ABD-PELV W/ CM
2 of 4 series · 16 of 46 positions shown, 18 images · IV contrast (APPLIED)
Comparison: CT 11/19/2020

CLINICAL DATA: Left lower quadrant pain

EXAM:
CT ABDOMEN AND PELVIS WITH CONTRAST
TECHNIQUE: Multidetector CT imaging of the abdomen and pelvis was performed
using the standard protocol following bolus administration of
intravenous contrast.

[Series 2: abd pel w · axial · 0.85mm/px · z∈[+601,+1036]mm · 13 of 95 slices shown, 15 images]
[im 4/95  soft-tissue]
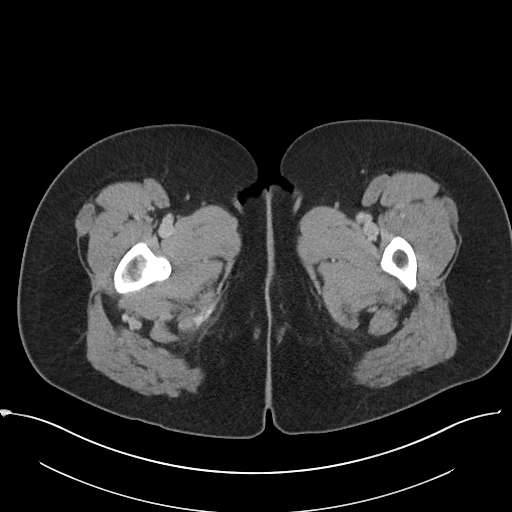
[im 4/95  bone]
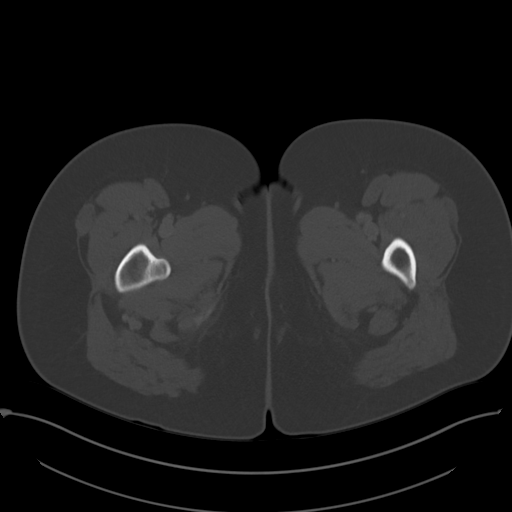
[im 12/95  soft-tissue]
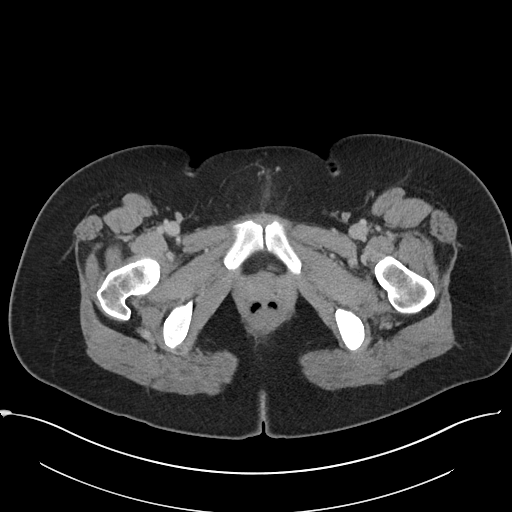
[im 19/95  soft-tissue]
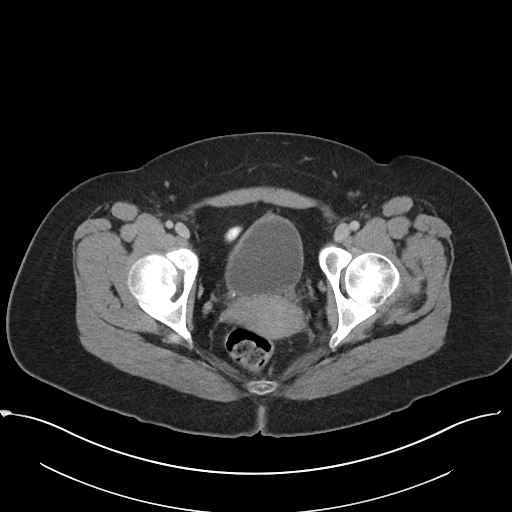
[im 27/95  soft-tissue]
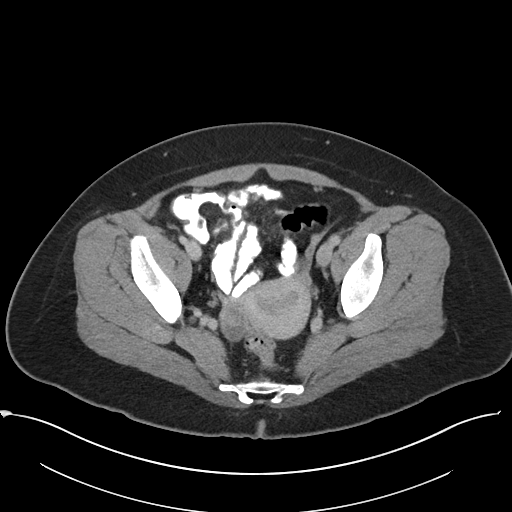
[im 34/95  soft-tissue]
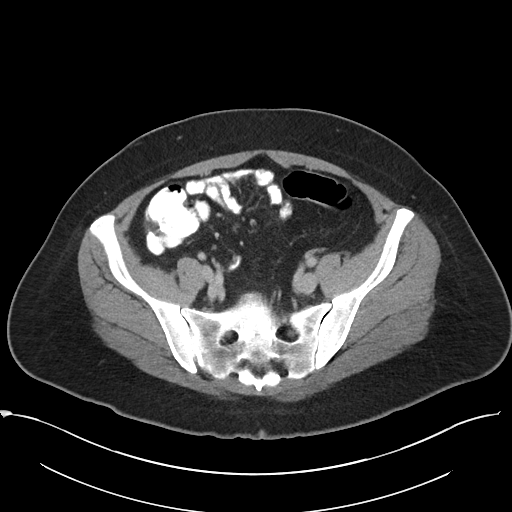
[im 42/95  soft-tissue]
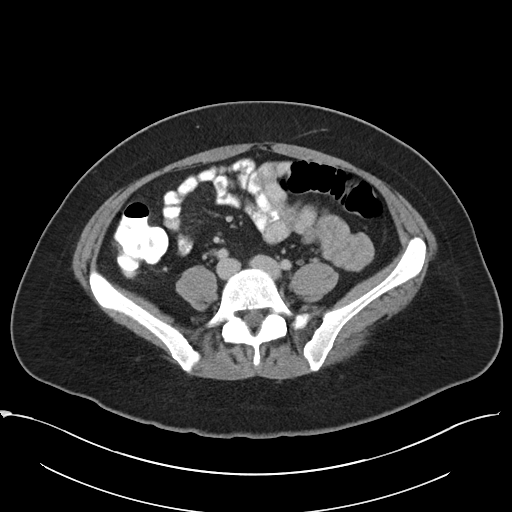
[im 49/95  soft-tissue]
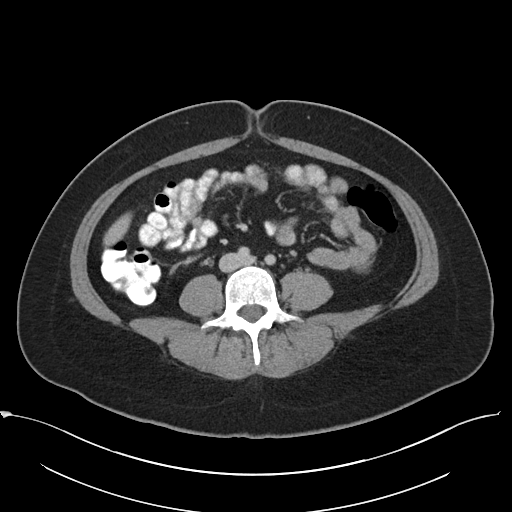
[im 53/95  soft-tissue]
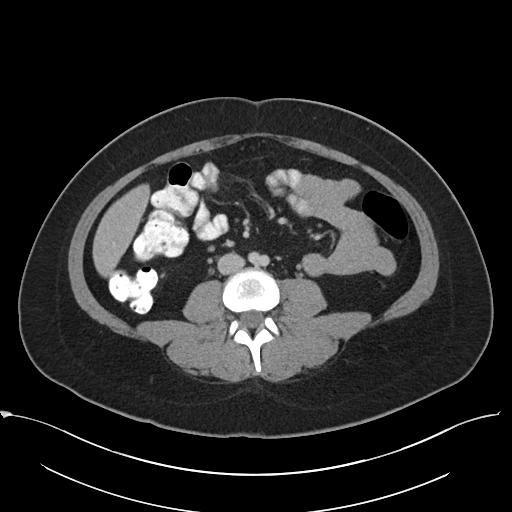
[im 61/95  soft-tissue]
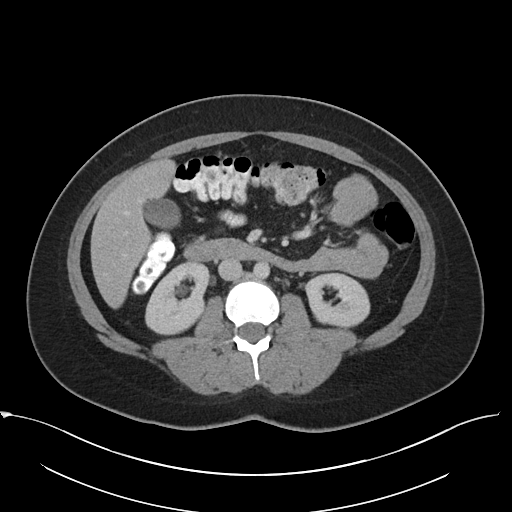
[im 61/95  bone]
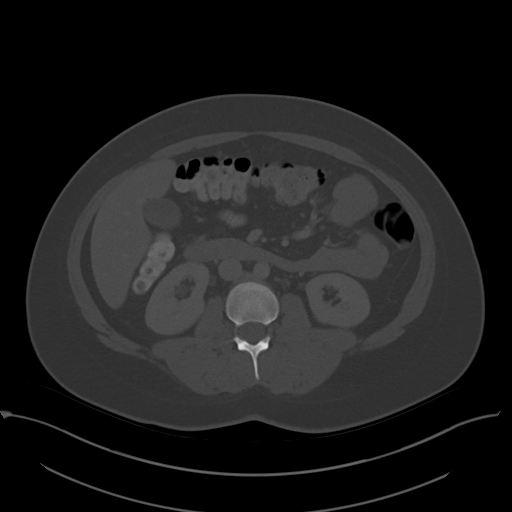
[im 68/95  soft-tissue]
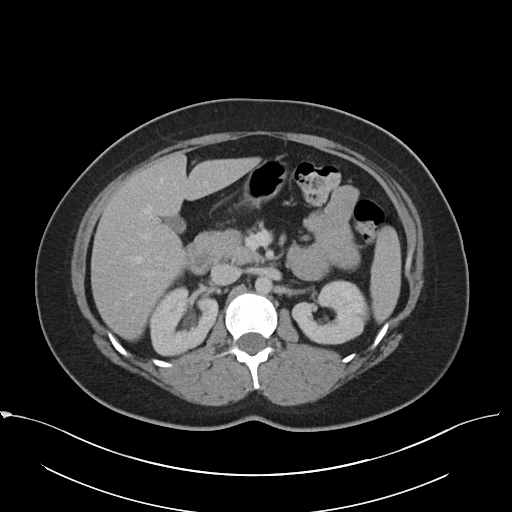
[im 76/95  soft-tissue]
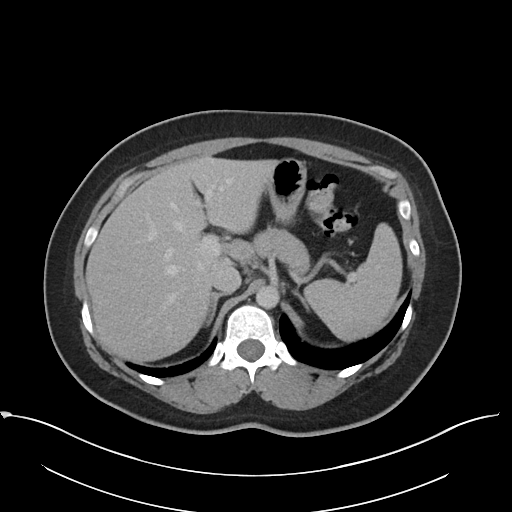
[im 83/95  soft-tissue]
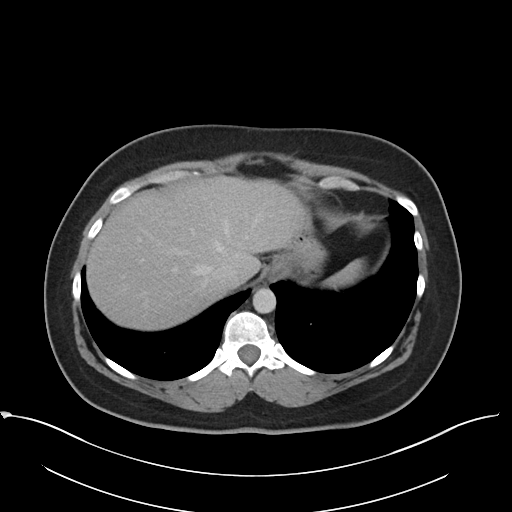
[im 91/95  soft-tissue]
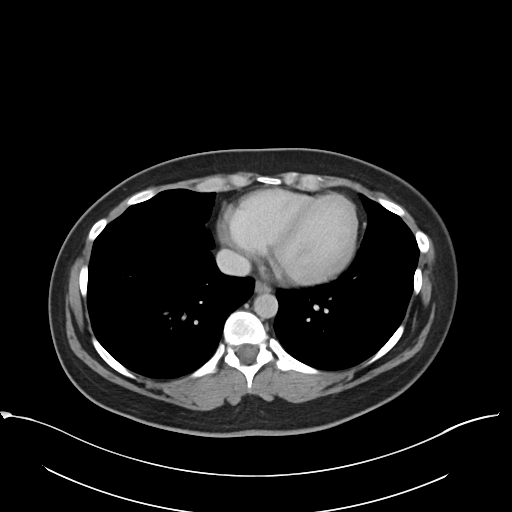

[Series 5: coronal · coronal · 0.86mm/px · 3 of 105 slices shown]
[im 35/105  soft-tissue]
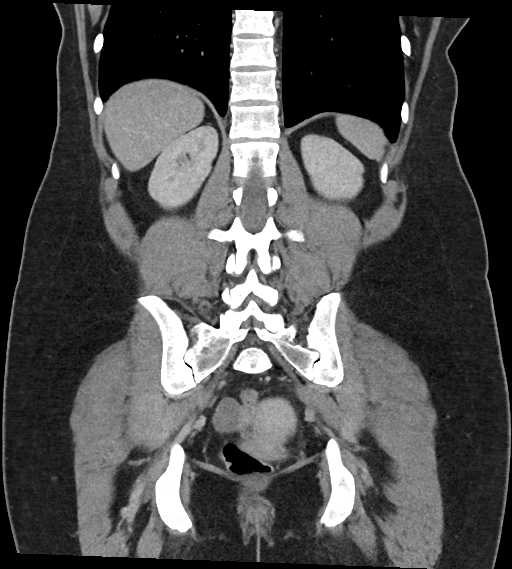
[im 47/105  soft-tissue]
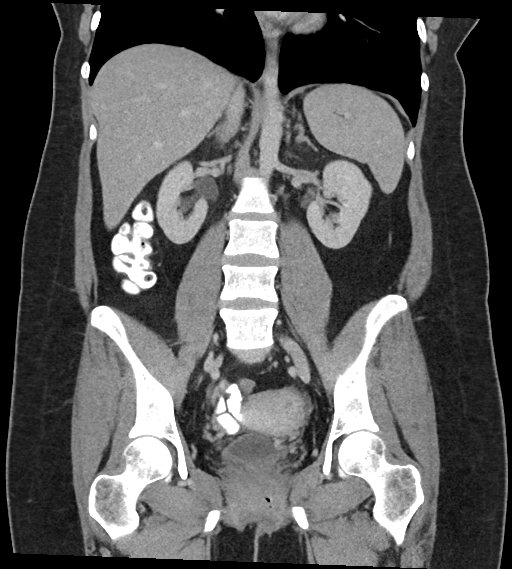
[im 58/105  soft-tissue]
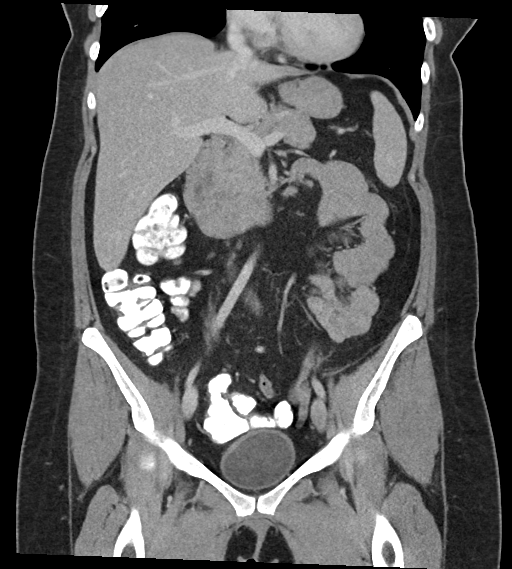

[16 of 46 positions shown; findings below may reference images not displayed]

RADIATION DOSE REDUCTION: This exam was performed according to the
departmental dose-optimization program which includes automated
exposure control, adjustment of the mA and/or kV according to
patient size and/or use of iterative reconstruction technique.

CONTRAST:  85mL OMNIPAQUE IOHEXOL 300 MG/ML  SOLN
FINDINGS: Lower chest: No acute abnormality.

Hepatobiliary: No focal liver abnormality is seen. No gallstones,
gallbladder wall thickening, or biliary dilatation.

Pancreas: Unremarkable. No pancreatic ductal dilatation or
surrounding inflammatory changes.

Spleen: Normal in size without focal abnormality.

Adrenals/Urinary Tract: Adrenal glands are normal. New mild right
hydronephrosis and hydroureter, secondary to a punctate 1-2 mm stone
at the right UVJ. Bladder otherwise unremarkable.

Stomach/Bowel: Stomach is within normal limits. Appendix appears
normal. No evidence of bowel wall thickening, distention, or
inflammatory changes.

Vascular/Lymphatic: No significant vascular findings are present. No
enlarged abdominal or pelvic lymph nodes.

Reproductive: Uterus and bilateral adnexa are unremarkable.

Other: No abdominal wall hernia or abnormality. No abdominopelvic
ascites. Small fat containing umbilical hernia

Musculoskeletal: No acute or significant osseous findings.
IMPRESSION: 1. New mild right hydronephrosis and hydroureter, secondary to a
punctate 1-2 mm stone at the right UVJ.
2. Otherwise no CT evidence for acute intra-abdominal or pelvic
abnormality

These results will be called to the ordering clinician or
representative by the Radiologist Assistant, and communication
documented in the PACS or [REDACTED].
